# Patient Record
Sex: Male | Born: 1945 | Race: White | Hispanic: No | Smoking: Former smoker
Health system: Southern US, Community
[De-identification: ages and names within clinical notes are randomized; demographics above are authoritative.]

## PROBLEM LIST (undated history)

## (undated) DIAGNOSIS — N400 Enlarged prostate without lower urinary tract symptoms: Secondary | ICD-10-CM

## (undated) DIAGNOSIS — R51 Headache: Secondary | ICD-10-CM

## (undated) DIAGNOSIS — Z8739 Personal history of other diseases of the musculoskeletal system and connective tissue: Secondary | ICD-10-CM

## (undated) DIAGNOSIS — E785 Hyperlipidemia, unspecified: Secondary | ICD-10-CM

## (undated) DIAGNOSIS — R519 Headache, unspecified: Secondary | ICD-10-CM

## (undated) DIAGNOSIS — I10 Essential (primary) hypertension: Secondary | ICD-10-CM

## (undated) DIAGNOSIS — E119 Type 2 diabetes mellitus without complications: Secondary | ICD-10-CM

## (undated) HISTORY — DX: Hyperlipidemia, unspecified: E78.5

## (undated) HISTORY — DX: Essential (primary) hypertension: I10

## (undated) HISTORY — DX: Personal history of other diseases of the musculoskeletal system and connective tissue: Z87.39

## (undated) HISTORY — DX: Benign prostatic hyperplasia without lower urinary tract symptoms: N40.0

## (undated) HISTORY — DX: Type 2 diabetes mellitus without complications: E11.9

---

## 1987-05-06 HISTORY — PX: NASAL SINUS SURGERY: SHX719

## 1994-05-05 HISTORY — PX: UMBILICAL HERNIA REPAIR: SHX196

## 1998-05-14 ENCOUNTER — Emergency Department (HOSPITAL_COMMUNITY): Admission: EM | Admit: 1998-05-14 | Discharge: 1998-05-14 | Payer: Self-pay | Admitting: Emergency Medicine

## 1998-10-19 ENCOUNTER — Encounter: Payer: Self-pay | Admitting: General Surgery

## 1998-10-23 ENCOUNTER — Ambulatory Visit (HOSPITAL_COMMUNITY): Admission: RE | Admit: 1998-10-23 | Discharge: 1998-10-23 | Payer: Self-pay | Admitting: General Surgery

## 1998-10-23 ENCOUNTER — Encounter (INDEPENDENT_AMBULATORY_CARE_PROVIDER_SITE_OTHER): Payer: Self-pay

## 2001-05-19 ENCOUNTER — Encounter: Admission: RE | Admit: 2001-05-19 | Discharge: 2001-06-25 | Payer: Self-pay | Admitting: Internal Medicine

## 2001-07-13 ENCOUNTER — Encounter: Payer: Self-pay | Admitting: Emergency Medicine

## 2001-07-13 ENCOUNTER — Emergency Department (HOSPITAL_COMMUNITY): Admission: EM | Admit: 2001-07-13 | Discharge: 2001-07-13 | Payer: Self-pay | Admitting: Emergency Medicine

## 2001-07-14 ENCOUNTER — Emergency Department (HOSPITAL_COMMUNITY): Admission: EM | Admit: 2001-07-14 | Discharge: 2001-07-14 | Payer: Self-pay | Admitting: Emergency Medicine

## 2004-02-14 ENCOUNTER — Ambulatory Visit (HOSPITAL_COMMUNITY): Admission: RE | Admit: 2004-02-14 | Discharge: 2004-02-14 | Payer: Self-pay | Admitting: Urology

## 2004-02-14 ENCOUNTER — Ambulatory Visit (HOSPITAL_BASED_OUTPATIENT_CLINIC_OR_DEPARTMENT_OTHER): Admission: RE | Admit: 2004-02-14 | Discharge: 2004-02-14 | Payer: Self-pay | Admitting: Urology

## 2004-03-06 ENCOUNTER — Ambulatory Visit: Payer: Self-pay | Admitting: Internal Medicine

## 2004-03-13 ENCOUNTER — Ambulatory Visit: Payer: Self-pay | Admitting: Internal Medicine

## 2004-06-10 ENCOUNTER — Ambulatory Visit: Payer: Self-pay | Admitting: Internal Medicine

## 2004-06-17 ENCOUNTER — Ambulatory Visit: Payer: Self-pay | Admitting: Internal Medicine

## 2004-09-03 ENCOUNTER — Ambulatory Visit: Payer: Self-pay | Admitting: Internal Medicine

## 2004-09-12 ENCOUNTER — Ambulatory Visit: Payer: Self-pay | Admitting: Internal Medicine

## 2004-11-07 ENCOUNTER — Ambulatory Visit: Payer: Self-pay | Admitting: Internal Medicine

## 2004-11-14 ENCOUNTER — Ambulatory Visit: Payer: Self-pay | Admitting: Internal Medicine

## 2005-03-19 ENCOUNTER — Ambulatory Visit: Payer: Self-pay | Admitting: Internal Medicine

## 2005-03-24 ENCOUNTER — Ambulatory Visit: Payer: Self-pay | Admitting: Internal Medicine

## 2005-04-25 ENCOUNTER — Ambulatory Visit (HOSPITAL_BASED_OUTPATIENT_CLINIC_OR_DEPARTMENT_OTHER): Admission: RE | Admit: 2005-04-25 | Discharge: 2005-04-25 | Payer: Self-pay | Admitting: Internal Medicine

## 2005-04-30 ENCOUNTER — Ambulatory Visit: Payer: Self-pay | Admitting: Internal Medicine

## 2005-05-05 HISTORY — PX: KIDNEY STONE SURGERY: SHX686

## 2005-05-07 ENCOUNTER — Ambulatory Visit: Payer: Self-pay | Admitting: Internal Medicine

## 2005-05-09 ENCOUNTER — Ambulatory Visit: Payer: Self-pay | Admitting: Pulmonary Disease

## 2005-08-04 ENCOUNTER — Ambulatory Visit: Payer: Self-pay | Admitting: Internal Medicine

## 2005-09-02 ENCOUNTER — Ambulatory Visit: Payer: Self-pay | Admitting: Internal Medicine

## 2005-10-21 ENCOUNTER — Ambulatory Visit: Payer: Self-pay | Admitting: Internal Medicine

## 2005-10-23 ENCOUNTER — Ambulatory Visit: Payer: Self-pay | Admitting: Internal Medicine

## 2005-12-12 ENCOUNTER — Ambulatory Visit: Payer: Self-pay | Admitting: Internal Medicine

## 2005-12-17 ENCOUNTER — Ambulatory Visit: Payer: Self-pay | Admitting: Internal Medicine

## 2006-02-23 ENCOUNTER — Ambulatory Visit: Payer: Self-pay | Admitting: Internal Medicine

## 2006-02-23 LAB — CONVERTED CEMR LAB: Hgb A1c MFr Bld: 7.1 % — ABNORMAL HIGH (ref 4.6–6.0)

## 2006-12-08 ENCOUNTER — Encounter: Payer: Self-pay | Admitting: Internal Medicine

## 2007-01-25 ENCOUNTER — Ambulatory Visit: Payer: Self-pay | Admitting: Internal Medicine

## 2007-01-25 LAB — CONVERTED CEMR LAB
ALT: 22 units/L (ref 0–53)
AST: 21 units/L (ref 0–37)
Albumin: 3.9 g/dL (ref 3.5–5.2)
Alkaline Phosphatase: 49 units/L (ref 39–117)
BUN: 20 mg/dL (ref 6–23)
Basophils Absolute: 0 10*3/uL (ref 0.0–0.1)
Basophils Relative: 0.4 % (ref 0.0–1.0)
Bilirubin Urine: NEGATIVE
Bilirubin, Direct: 0.2 mg/dL (ref 0.0–0.3)
Blood in Urine, dipstick: NEGATIVE
CO2: 26 meq/L (ref 19–32)
Calcium: 9.1 mg/dL (ref 8.4–10.5)
Chloride: 110 meq/L (ref 96–112)
Cholesterol: 130 mg/dL (ref 0–200)
Creatinine, Ser: 1.2 mg/dL (ref 0.4–1.5)
Creatinine,U: 197 mg/dL
Eosinophils Absolute: 0.2 10*3/uL (ref 0.0–0.6)
Eosinophils Relative: 3.1 % (ref 0.0–5.0)
GFR calc Af Amer: 79 mL/min
GFR calc non Af Amer: 65 mL/min
Glucose, Bld: 168 mg/dL — ABNORMAL HIGH (ref 70–99)
Glucose, Urine, Semiquant: NEGATIVE
HCT: 42.5 % (ref 39.0–52.0)
HDL: 38.6 mg/dL — ABNORMAL LOW (ref >39.0)
Hemoglobin: 14.5 g/dL (ref 13.0–17.0)
Hgb A1c MFr Bld: 6.9 % — ABNORMAL HIGH (ref 4.6–6.0)
Ketones, urine, test strip: NEGATIVE
LDL Cholesterol: 76 mg/dL (ref 0–99)
Lymphocytes Relative: 30.4 % (ref 12.0–46.0)
MCHC: 34.1 g/dL (ref 30.0–36.0)
MCV: 94.9 fL (ref 78.0–100.0)
Microalb Creat Ratio: 4.1 mg/g (ref 0.0–30.0)
Microalb, Ur: 0.8 mg/dL (ref 0.0–1.9)
Monocytes Absolute: 0.4 10*3/uL (ref 0.2–0.7)
Monocytes Relative: 7.6 % (ref 3.0–11.0)
Neutro Abs: 3 10*3/uL (ref 1.4–7.7)
Neutrophils Relative %: 58.5 % (ref 43.0–77.0)
Nitrite: NEGATIVE
PSA: 3.79 ng/mL (ref 0.10–4.00)
Platelets: 204 10*3/uL (ref 150–400)
Potassium: 5.3 meq/L — ABNORMAL HIGH (ref 3.5–5.1)
Protein, U semiquant: NEGATIVE
RBC: 4.48 M/uL (ref 4.22–5.81)
RDW: 12.6 % (ref 11.5–14.6)
Sodium: 141 meq/L (ref 135–145)
Specific Gravity, Urine: 1.025
TSH: 1.14 microintl units/mL (ref 0.35–5.50)
Total Bilirubin: 1.5 mg/dL — ABNORMAL HIGH (ref 0.3–1.2)
Total CHOL/HDL Ratio: 3.4
Total Protein: 6.1 g/dL (ref 6.0–8.3)
Triglycerides: 78 mg/dL (ref 0–149)
Urobilinogen, UA: 0.2
VLDL: 16 mg/dL (ref 0–40)
WBC: 5.2 10*3/uL (ref 4.5–10.5)
pH: 5

## 2007-02-05 ENCOUNTER — Ambulatory Visit: Payer: Self-pay | Admitting: Internal Medicine

## 2007-02-05 DIAGNOSIS — E119 Type 2 diabetes mellitus without complications: Secondary | ICD-10-CM | POA: Insufficient documentation

## 2007-02-05 DIAGNOSIS — E785 Hyperlipidemia, unspecified: Secondary | ICD-10-CM | POA: Insufficient documentation

## 2007-02-05 DIAGNOSIS — E1165 Type 2 diabetes mellitus with hyperglycemia: Secondary | ICD-10-CM

## 2007-02-05 DIAGNOSIS — N400 Enlarged prostate without lower urinary tract symptoms: Secondary | ICD-10-CM

## 2007-02-05 HISTORY — DX: Hyperlipidemia, unspecified: E78.5

## 2007-02-05 HISTORY — DX: Benign prostatic hyperplasia without lower urinary tract symptoms: N40.0

## 2007-02-05 HISTORY — DX: Type 2 diabetes mellitus without complications: E11.9

## 2007-06-07 ENCOUNTER — Ambulatory Visit: Payer: Self-pay | Admitting: Internal Medicine

## 2007-06-07 LAB — CONVERTED CEMR LAB: Hgb A1c MFr Bld: 8 % — ABNORMAL HIGH (ref 4.6–6.0)

## 2007-06-14 ENCOUNTER — Ambulatory Visit: Payer: Self-pay | Admitting: Internal Medicine

## 2007-06-14 LAB — CONVERTED CEMR LAB
Cholesterol, target level: 200 mg/dL
HDL goal, serum: 40 mg/dL
LDL Goal: 100 mg/dL

## 2007-08-09 ENCOUNTER — Ambulatory Visit: Payer: Self-pay | Admitting: Internal Medicine

## 2007-08-09 LAB — CONVERTED CEMR LAB: Hgb A1c MFr Bld: 7.3 % — ABNORMAL HIGH (ref 4.6–6.0)

## 2007-08-16 ENCOUNTER — Ambulatory Visit: Payer: Self-pay | Admitting: Internal Medicine

## 2007-11-08 ENCOUNTER — Ambulatory Visit: Payer: Self-pay | Admitting: Internal Medicine

## 2007-11-08 LAB — CONVERTED CEMR LAB: Hgb A1c MFr Bld: 7.1 % — ABNORMAL HIGH (ref 4.6–6.0)

## 2007-11-15 ENCOUNTER — Ambulatory Visit: Payer: Self-pay | Admitting: Internal Medicine

## 2007-11-19 ENCOUNTER — Encounter: Payer: Self-pay | Admitting: Internal Medicine

## 2008-03-07 ENCOUNTER — Ambulatory Visit: Payer: Self-pay | Admitting: Internal Medicine

## 2008-03-07 LAB — CONVERTED CEMR LAB
Albumin: 3.7 g/dL (ref 3.5–5.2)
Alkaline Phosphatase: 55 units/L (ref 39–117)
BUN: 17 mg/dL (ref 6–23)
Bilirubin Urine: NEGATIVE
Cholesterol: 119 mg/dL (ref 0–200)
Eosinophils Absolute: 0.1 10*3/uL (ref 0.0–0.7)
Eosinophils Relative: 1.8 % (ref 0.0–5.0)
GFR calc Af Amer: 97 mL/min
GFR calc non Af Amer: 80 mL/min
HCT: 40.9 % (ref 39.0–52.0)
HDL: 41.6 mg/dL (ref >39.0)
Hemoglobin, Urine: NEGATIVE
Ketones, ur: NEGATIVE mg/dL
MCV: 94.1 fL (ref 78.0–100.0)
Monocytes Absolute: 0.4 10*3/uL (ref 0.1–1.0)
PSA: 4.01 ng/mL — ABNORMAL HIGH (ref 0.10–4.00)
Platelets: 200 10*3/uL (ref 150–400)
Potassium: 4.5 meq/L (ref 3.5–5.1)
RDW: 12.4 % (ref 11.5–14.6)
Sodium: 138 meq/L (ref 135–145)
TSH: 1.24 microintl units/mL (ref 0.35–5.50)
Total Protein, Urine: NEGATIVE mg/dL
Urine Glucose: 100 mg/dL — AB
VLDL: 14 mg/dL (ref 0–40)

## 2008-03-13 ENCOUNTER — Ambulatory Visit: Payer: Self-pay | Admitting: Internal Medicine

## 2008-03-20 ENCOUNTER — Emergency Department (HOSPITAL_COMMUNITY): Admission: EM | Admit: 2008-03-20 | Discharge: 2008-03-20 | Payer: Self-pay | Admitting: Emergency Medicine

## 2008-03-28 ENCOUNTER — Telehealth: Payer: Self-pay | Admitting: Internal Medicine

## 2008-04-07 ENCOUNTER — Encounter: Payer: Self-pay | Admitting: Internal Medicine

## 2008-06-26 ENCOUNTER — Ambulatory Visit: Payer: Self-pay | Admitting: Internal Medicine

## 2008-06-26 LAB — CONVERTED CEMR LAB: Hgb A1c MFr Bld: 8 % — ABNORMAL HIGH (ref 4.6–6.0)

## 2008-07-03 ENCOUNTER — Ambulatory Visit: Payer: Self-pay | Admitting: Internal Medicine

## 2008-09-04 ENCOUNTER — Ambulatory Visit: Payer: Self-pay | Admitting: Internal Medicine

## 2008-09-04 LAB — CONVERTED CEMR LAB: Hgb A1c MFr Bld: 7.8 % — ABNORMAL HIGH (ref 4.6–6.5)

## 2008-09-11 ENCOUNTER — Ambulatory Visit: Payer: Self-pay | Admitting: Internal Medicine

## 2008-10-09 ENCOUNTER — Ambulatory Visit: Payer: Self-pay | Admitting: Internal Medicine

## 2008-12-05 ENCOUNTER — Ambulatory Visit: Payer: Self-pay | Admitting: Internal Medicine

## 2008-12-05 LAB — CONVERTED CEMR LAB
Calcium: 9 mg/dL (ref 8.4–10.5)
Chloride: 106 meq/L (ref 96–112)
Creatinine, Ser: 1 mg/dL (ref 0.4–1.5)
GFR calc non Af Amer: 80.04 mL/min (ref >60)
Hgb A1c MFr Bld: 9.8 % — ABNORMAL HIGH (ref 4.6–6.5)

## 2008-12-12 ENCOUNTER — Ambulatory Visit: Payer: Self-pay | Admitting: Internal Medicine

## 2009-03-07 ENCOUNTER — Ambulatory Visit: Payer: Self-pay | Admitting: Internal Medicine

## 2009-03-07 LAB — CONVERTED CEMR LAB: Hgb A1c MFr Bld: 8.7 % — ABNORMAL HIGH (ref 4.6–6.5)

## 2009-03-12 ENCOUNTER — Ambulatory Visit: Payer: Self-pay | Admitting: Internal Medicine

## 2009-04-11 ENCOUNTER — Ambulatory Visit: Payer: Self-pay | Admitting: Internal Medicine

## 2009-05-02 ENCOUNTER — Encounter: Payer: Self-pay | Admitting: Internal Medicine

## 2009-06-18 ENCOUNTER — Encounter: Admission: RE | Admit: 2009-06-18 | Discharge: 2009-06-18 | Payer: Self-pay | Admitting: Internal Medicine

## 2009-06-20 ENCOUNTER — Encounter: Payer: Self-pay | Admitting: Internal Medicine

## 2009-06-27 ENCOUNTER — Ambulatory Visit: Payer: Self-pay | Admitting: Internal Medicine

## 2009-07-04 ENCOUNTER — Ambulatory Visit: Payer: Self-pay | Admitting: Internal Medicine

## 2009-07-04 DIAGNOSIS — R05 Cough: Secondary | ICD-10-CM

## 2009-09-26 ENCOUNTER — Ambulatory Visit: Payer: Self-pay | Admitting: Internal Medicine

## 2009-10-04 ENCOUNTER — Ambulatory Visit: Payer: Self-pay | Admitting: Internal Medicine

## 2009-11-08 ENCOUNTER — Ambulatory Visit: Payer: Self-pay | Admitting: Internal Medicine

## 2009-11-13 ENCOUNTER — Encounter: Payer: Self-pay | Admitting: Internal Medicine

## 2009-11-15 ENCOUNTER — Ambulatory Visit: Payer: Self-pay | Admitting: Internal Medicine

## 2010-01-23 ENCOUNTER — Encounter: Payer: Self-pay | Admitting: Internal Medicine

## 2010-02-13 ENCOUNTER — Ambulatory Visit: Payer: Self-pay | Admitting: Internal Medicine

## 2010-02-13 LAB — CONVERTED CEMR LAB
AST: 19 units/L (ref 0–37)
Alkaline Phosphatase: 62 units/L (ref 39–117)
BUN: 18 mg/dL (ref 6–23)
CO2: 27 meq/L (ref 19–32)
Calcium: 9 mg/dL (ref 8.4–10.5)
Creatinine, Ser: 1 mg/dL (ref 0.4–1.5)
Glucose, Bld: 239 mg/dL — ABNORMAL HIGH (ref 70–99)
LDL Cholesterol: 66 mg/dL (ref 0–99)
Total Bilirubin: 1.1 mg/dL (ref 0.3–1.2)
Total CHOL/HDL Ratio: 4

## 2010-02-20 ENCOUNTER — Ambulatory Visit: Payer: Self-pay | Admitting: Internal Medicine

## 2010-05-17 ENCOUNTER — Ambulatory Visit
Admission: RE | Admit: 2010-05-17 | Discharge: 2010-05-17 | Payer: Self-pay | Source: Home / Self Care | Attending: Internal Medicine | Admitting: Internal Medicine

## 2010-05-17 ENCOUNTER — Other Ambulatory Visit: Payer: Self-pay | Admitting: Internal Medicine

## 2010-05-17 LAB — MICROALBUMIN / CREATININE URINE RATIO
Creatinine,U: 188 mg/dL
Microalb Creat Ratio: 0.7 mg/g (ref 0.0–30.0)
Microalb, Ur: 1.3 mg/dL (ref 0.0–1.9)

## 2010-05-17 LAB — HEMOGLOBIN A1C: Hgb A1c MFr Bld: 9.9 % — ABNORMAL HIGH (ref 4.6–6.5)

## 2010-05-23 ENCOUNTER — Encounter: Payer: Self-pay | Admitting: *Deleted

## 2010-05-24 ENCOUNTER — Encounter: Payer: Self-pay | Admitting: *Deleted

## 2010-05-24 ENCOUNTER — Ambulatory Visit
Admission: RE | Admit: 2010-05-24 | Discharge: 2010-05-24 | Payer: Self-pay | Source: Home / Self Care | Attending: Internal Medicine | Admitting: Internal Medicine

## 2010-05-24 DIAGNOSIS — E0842 Diabetes mellitus due to underlying condition with diabetic polyneuropathy: Secondary | ICD-10-CM

## 2010-05-24 NOTE — Assessment & Plan Note (Addendum)
  Subjective:    Corey Nielsen is a 65 y.o. male who presents for follow-up of Type 2 diabetes mellitus.  Current symptoms/problems include paresthesia of the feet and have been worsening. Symptoms have been present for 3 months.  Known diabetic complications: peripheral neuropathy Cardiovascular risk factors: diabetes mellitus Current diabetic medications include oral agents (triple therapy): kombiglyze and insulin injections: Lantus : .  Eye exam current (within one year): yes Weight trend: increasing steadily Prior visit with dietician: no Current diet: in general, a "healthy" diet   Current exercise: aerobics and gardening  Current monitoring regimen: home blood tests - 2 times daily Home blood sugar records: fasting range: 140-200 Any episodes of hypoglycemia? no  Is He on ACE inhibitor or angiotensin II receptor blocker?  No  none none  The following portions of the patient's history were reviewed and updated as appropriate: past medical history.  Review of Systems A comprehensive review of systems was negative.    Objective:    BP 122/74  Pulse 80  Temp(Src) 98.1 F (36.7 C) (Oral)  Ht 6\' 5"  (1.956 m)  Wt 208 lb (94.348 kg)  BMI 24.67 kg/m2  General:  alert  Oropharynx: normal findings: soft palate, uvula, and tonsils normal   Eyes:  conjunctivae/corneas clear. PERRL, EOM's intact. Fundi benign.   Ears:  normal TM and external ear canal right ear  Neck: no adenopathy, supple, symmetrical, trachea midline and thyroid not enlarged, symmetric, no tenderness/mass/nodules  Thyroid:  no palpable nodule  Lung: clear to auscultation bilaterally  Heart:  regular rate and rhythm, S1, S2 normal, no murmur, click, rub or gallop  Abdomen: soft, non-tender; bowel sounds normal; no masses,  no organomegaly  Extremities: extremities normal, atraumatic, no cyanosis or edema  Skin: warm and dry, no hyperpigmentation, vitiligo, or suspicious lesions  Pulses: 2+ and symmetric    Neuro: normal without focal findings, mental status, speech normal, alert and oriented x3, PERLA and reflexes normal and symmetric   Lab Review No results found for this basename: GLUCOSE,  SODIUM,  POTASSIUM,  CHLORIDE,  CO2,  BUN,  CREATININE   none    Assessment:    Diabetes Mellitus type II, under poor control.    Plan:    1.  Rx changes: none added amaryl 4 mg 2.  Education: Reviewed 'ABCs' of diabetes management (respective goals in parentheses):  A1C (<7), blood pressure (<130/80), and cholesterol (LDL <100). 3.  Compliance at present is estimated to be fair. Efforts to improve compliance (if necessary) will be directed at dietary modifications: 3, increased exercise and regular blood sugar monitoring: 3 times daily. 4. Follow up: 3 months

## 2010-05-24 NOTE — Progress Notes (Signed)
Subjective:     Patient ID: Corey Nielsen is a 65 y.o. male.  Diabetes He presents for his follow-up diabetic visit. He has type 2 diabetes mellitus. MedicAlert identification noted: has a card in wallet. The initial diagnosis of diabetes was made 15 years ago. His disease course has been improving. There are no hypoglycemic associated symptoms. Associated symptoms include foot paresthesias. Pertinent negatives for diabetes include no blurred vision, no chest pain, no fatigue, no foot ulcerations, no polydipsia, no polyphagia, no polyuria, no visual change, no weakness and no weight loss. Symptoms are improving. He is compliant with treatment most of the time. His weight is stable. He is following a diabetic diet. Meal planning includes avoidance of concentrated sweets. He has had a previous visit with a dietician. He participates in exercise three times a week. His home blood glucose trend is decreasing steadily. His breakfast blood glucose is taken between 6-7 am. His breakfast blood glucose range is generally 110-130 mg/dl. An ACE inhibitor/angiotensin II receptor blocker is not being taken. He does not see a podiatrist.Eye exam is current (seeing dr Emily Filbert).    The following portions of the patient's history were reviewed and updated as appropriate: past social history.  Review of Systems  Constitutional: Negative for weight loss and fatigue.  Eyes: Positive for redness. Negative for blurred vision.       Injected dry eyes  Respiratory: Negative.   Cardiovascular: Negative.  Negative for chest pain.  Genitourinary: Negative.  Negative for polyuria.  Skin: Negative.   Neurological: Positive for numbness. Negative for weakness.       Numbness in feet /stocking foot distribution  Hematological: Negative for polydipsia and polyphagia.  Psychiatric/Behavioral: Negative.        Objective:   Physical Exam  Constitutional: He is oriented to person, place, and time. He appears well-developed  and well-nourished.  HENT:  Head: Normocephalic and atraumatic.  Eyes: Right conjunctiva is injected.         There is redness and vein prominance  Cardiovascular: Normal rate, regular rhythm and normal heart sounds.   Pulmonary/Chest: Effort normal and breath sounds normal.  Abdominal: Soft. Bowel sounds are normal.  Neurological: He is alert and oriented to person, place, and time.  Skin: Skin is warm and dry.       Assessment:     1. Poorly controlled DM    Plan:           2

## 2010-06-06 NOTE — Assessment & Plan Note (Signed)
Summary: 3 month rov/njr   Vital Signs:  Patient profile:   65 year old male Height:      77 inches Weight:      210 pounds Temp:     98.2 degrees F oral Pulse rate:   76 / minute Resp:     14 per minute BP sitting:   136 / 80  (left arm)  Vitals Entered By: Willy Eddy, LPN (February 20, 2010 11:23 AM) CC: roa labs, Lipid Management, Type 2 diabetes mellitus follow-up Is Patient Diabetic? No   Primary Care Provider:  Stacie Glaze MD  CC:  roa labs, Lipid Management, and Type 2 diabetes mellitus follow-up.  History of Present Illness: follow up for DM and lipids  Type 2 Diabetes Mellitus Follow-Up      This is a 65 year old man who presents for Type 2 diabetes mellitus follow-up.  poor control with glucoses in the 200 range.  The patient denies polyuria, polydipsia, blurred vision, self managed hypoglycemia, hypoglycemia requiring help, weight loss, weight gain, and numbness of extremities.  Since the last visit the patient reports compliance with medications and not exercising regularly.  The patient has been measuring capillary blood glucose before breakfast and before dinner.  Since the last visit, the patient reports having had eye care by an ophthalmologist.    Lipid Management History:      Positive NCEP/ATP III risk factors include male age 2 years old or older and diabetes.  Negative NCEP/ATP III risk factors include non-tobacco-user status, non-hypertensive, no ASHD (atherosclerotic heart disease), no prior stroke/TIA, no peripheral vascular disease, and no history of aortic aneurysm.     Preventive Screening-Counseling & Management  Alcohol-Tobacco     Smoking Status: quit     Tobacco Counseling: not indicated; no tobacco use  Current Problems (verified): 1)  Cough  (ICD-786.2) 2)  Family History of Cad Male 1st Degree Relative <50  (ICD-V17.3) 3)  Family History Diabetes 1st Degree Relative  (ICD-V18.0) 4)  Benign Prostatic Hypertrophy  (ICD-600.00) 5)   Hyperlipidemia  (ICD-272.4) 6)  Diabetes Mellitus, Type II  (ICD-250.00) 7)  Physical Examination  (ICD-V70.0)  Current Medications (verified): 1)  Lipitor 20 Mg  Tabs (Atorvastatin Calcium) .... Once Daily 2)  Vitamin C 1000 Mg  Tabs (Ascorbic Acid) .... Once Daily 3)  Vitamin D-1000 Max St [phone] Mg-Unit  Tabs (Calcium-Vitamin D) .... Once Daily 4)  Fish Oil 1000 Mg  Caps (Omega-3 Fatty Acids) .... 4 Capsules Days 5)  Alpha Lop0ic 200 Mg .... Once Daily 6)  Saw Palmetto Extract 160 Mg  Caps (Saw Palmetto (Serenoa Repens)) .... 2 Once Daily 7)  Bayer Low Strength 81 Mg  Tbec (Aspirin) .... Once Daily 8)  Freestyle Lite   Strp (Glucose Blood) .... Use As Directed Bid 9)  Co Q-10 30-5 Mg-Unit Caps (Coenzyme Q10-Vitamin E) .Marland Kitchen.. 1 Once Daily 10)  Kombiglyze Xr 09-998 Mg Xr24h-Tab (Saxagliptin-Metformin) .... One By Mouth Daily 11)  Lantus 100 Unit/ml Soln (Insulin Glargine) .... 30  Untis 12)  Calcium 1000 Mg .Marland Kitchen.. 1 Once Daily 13)  Jalyn 0.5-0.4 Mg Caps (Dutasteride-Tamsulosin Hcl) .... One By Mouth Daily  Allergies (verified): 1)  ! * Dt  Past History:  Family History: Last updated: 02/05/2007 Family History of Prostate CA 1st degree relative <50 Family History Diabetes 1st degree relative Family History of CAD Male 1st degree relative <50  MID 21'S  Social History: Last updated: 02/05/2007 Domestic Partner Former Smoker Alcohol use-yes Drug  use-no Regular exercise-yes  Risk Factors: Exercise: yes (02/05/2007)  Risk Factors: Smoking Status: quit (02/20/2010)  Past medical, surgical, family and social histories (including risk factors) reviewed, and no changes noted (except as noted below).  Past Medical History: Reviewed history from 02/05/2007 and no changes required. Diabetes mellitus, type II Hyperlipidemia Unremarkable Benign prostatic hypertrophy  Past Surgical History: Reviewed history from 02/05/2007 and no changes required. FOOT SURGERY Sinus  surgery Appendectomy  Family History: Reviewed history from 02/05/2007 and no changes required. Family History of Prostate CA 1st degree relative <50 Family History Diabetes 1st degree relative Family History of CAD Male 1st degree relative <50  MID 31'S  Social History: Reviewed history from 02/05/2007 and no changes required. Domestic Partner Former Smoker Alcohol use-yes Drug use-no Regular exercise-yes  Review of Systems  The patient denies anorexia, fever, weight loss, weight gain, vision loss, decreased hearing, hoarseness, chest pain, syncope, dyspnea on exertion, peripheral edema, prolonged cough, headaches, hemoptysis, abdominal pain, melena, hematochezia, severe indigestion/heartburn, hematuria, incontinence, genital sores, muscle weakness, suspicious skin lesions, transient blindness, difficulty walking, depression, unusual weight change, abnormal bleeding, enlarged lymph nodes, angioedema, breast masses, and testicular masses.         Flu Vaccine Consent Questions     Do you have a history of severe allergic reactions to this vaccine? no    Any prior history of allergic reactions to egg and/or gelatin? no    Do you have a sensitivity to the preservative Thimersol? no    Do you have a past history of Guillan-Barre Syndrome? no    Do you currently have an acute febrile illness? no    Have you ever had a severe reaction to latex? no    Vaccine information given and explained to patient? yes    Are you currently pregnant? no    Lot Number:[id number]   Exp Date:11/02/2010   Site Given  Left Deltoid IM   Physical Exam  General:  alert and well-nourished.   Head:  atraumatic.   Eyes:  pupils equal and pupils round.   Ears:  R ear normal and L ear normal.   Nose:  no external deformity and no nasal discharge.   Mouth:  good dentition and pharynx pink and moist.   Neck:  No deformities, masses, or tenderness noted. Lungs:  normal respiratory effort and no wheezes.    Heart:  normal rate and regular rhythm.   Abdomen:  soft and no splenomegaly.   Msk:  No deformity or scoliosis noted of thoracic or lumbar spine.   Pulses:  R and L carotid,radial,femoral,dorsalis pedis and posterior tibial pulses are full and equal bilaterally Extremities:  No clubbing, cyanosis, edema, or deformity noted with normal full range of motion of all joints.    Diabetes Management Exam:    Foot Exam (with socks and/or shoes not present):       Sensory-Pinprick/Light touch:          Left medial foot (L-4): normal          Left dorsal foot (L-5): normal          Left lateral foot (S-1): normal          Right medial foot (L-4): normal          Right dorsal foot (L-5): normal          Right lateral foot (S-1): normal       Sensory-Monofilament:          Left foot: normal  Right foot: normal       Inspection:          Left foot: normal          Right foot: normal       Nails:          Left foot: normal          Right foot: normal    Eye Exam:       Eye Exam done elsewhere          Date: 11/29/2009          Results: normal          Done by: Emily Filbert   Impression & Recommendations:  Problem # 1:  DIABETES MELLITUS, TYPE II (ICD-250.00)  The following medications were removed from the medication list:    Glimepiride 4 Mg Tabs (Glimepiride) ..... One by mouth daily His updated medication list for this problem includes:    Bayer Low Strength 81 Mg Tbec (Aspirin) ..... Once daily    Kombiglyze Xr 09-998 Mg Xr24h-tab (Saxagliptin-metformin) ..... One by mouth daily    Lantus 100 Unit/ml Soln (Insulin glargine) .Marland KitchenMarland KitchenMarland KitchenMarland Kitchen 30  untis  Labs Reviewed: Creat: 1.0 (12/05/2008)    Reviewed HgBA1c results: 8.5 (11/08/2009)  8.7 (09/26/2009)  Problem # 2:  HYPERLIPIDEMIA (ICD-272.4)  His updated medication list for this problem includes:    Lipitor 20 Mg Tabs (Atorvastatin calcium) ..... Once daily  Labs Reviewed: SGOT: 25 (03/07/2008)   SGPT: 24 (03/07/2008)  Lipid  Goals: Chol Goal: 200 (06/14/2007)   HDL Goal: 40 (06/14/2007)   LDL Goal: 100 (06/14/2007)   TG Goal: 150 (06/14/2007)  Prior 10 Yr Risk Heart Disease: 9 % (10/04/2009)   HDL:41.6 (03/07/2008), 38.6 (01/25/2007)  LDL:64 (03/07/2008), 76 (01/25/2007)  Chol:119 (03/07/2008), 130 (01/25/2007)  Trig:69 (03/07/2008), 78 (01/25/2007)  Complete Medication List: 1)  Lipitor 20 Mg Tabs (Atorvastatin calcium) .... Once daily 2)  Vitamin C 1000 Mg Tabs (Ascorbic acid) .... Once daily 3)  Vitamin D-1000 Max St [phone] Mg-unit Tabs (Calcium-vitamin d) .... Once daily 4)  Fish Oil 1000 Mg Caps (Omega-3 fatty acids) .... 4 capsules days 5)  Alpha Lop0ic 200 Mg  .... Once daily 6)  Saw Palmetto Extract 160 Mg Caps (Saw palmetto (serenoa repens)) .... 2 once daily 7)  Bayer Low Strength 81 Mg Tbec (Aspirin) .... Once daily 8)  Freestyle Lite Strp (Glucose blood) .... Use as directed bid 9)  Co Q-10 30-5 Mg-unit Caps (Coenzyme q10-vitamin e) .Marland Kitchen.. 1 once daily 10)  Kombiglyze Xr 09-998 Mg Xr24h-tab (Saxagliptin-metformin) .... One by mouth daily 11)  Lantus 100 Unit/ml Soln (Insulin glargine) .... 30  untis 12)  Calcium 1000 Mg  .Marland Kitchen.. 1 once daily 13)  Jalyn 0.5-0.4 Mg Caps (Dutasteride-tamsulosin hcl) .... One by mouth daily  Other Orders: Admin 1st Vaccine (16109) Flu Vaccine 57yrs + [phone])  Lipid Assessment/Plan:      Based on NCEP/ATP III, the patient's risk factor category is "history of diabetes".  The patient's lipid goals are as follows: Total cholesterol goal is 200; LDL cholesterol goal is 100; HDL cholesterol goal is 40; Triglyceride goal is 150.    Patient Instructions: 1)  Please schedule a follow-up appointment in 3 months. 2)  HbgA1C prior to visit, ICD-9:250.00 3)  Urine Microalbumin prior to visit, ICD-9:250.00   Orders Added: 1)  Admin 1st Vaccine [90471] 2)  Flu Vaccine 44yrs + [09811] 3)  Est. Patient Level IV [91478]

## 2010-06-06 NOTE — Assessment & Plan Note (Deleted)
Summary: 3 mo rov/mm   Vital Signs:  Patient profile:   65 year old male Height:      77 inches Weight:      208 pounds BMI:     24.75 Temp:     98.2 degrees F oral Pulse rate:   80 / minute Resp:     14 per minute BP sitting:   122 / 74  (left arm)  Vitals Entered By: Willy Eddy, LPN (May 24, 2010 11:06 AM) CC: roa labs Is Patient Diabetic? Yes Did you bring your meter with you today? No   Primary Care Provider:  Stacie Glaze MD  CC:  roa labs.  History of Present Illness: dm follow up documented in epic and cut and pasted  tes

## 2010-06-06 NOTE — Letter (Signed)
Summary: [facility]  [facility]   Imported By: Maryln Gottron 11/19/2009 11:03:30  _____________________________________________________________________  External Attachment:    Type:   Image     Comment:   External Document

## 2010-06-06 NOTE — Letter (Signed)
Summary: [facility]   Imported By: Maryln Gottron 05/07/2009 14:01:42  _____________________________________________________________________  External Attachment:    Type:   Image     Comment:   External Document

## 2010-06-06 NOTE — Letter (Signed)
Summary: [facility]  [facility]   Imported By: Maryln Gottron 01/29/2010 13:04:28  _____________________________________________________________________  External Attachment:    Type:   Image     Comment:   External Document

## 2010-06-06 NOTE — Assessment & Plan Note (Signed)
Summary: 6 wk rov/njr   Vital Signs:  Patient profile:   65 year old male Height:      77 inches Weight:      209 pounds BMI:     24.87 Temp:     98.2 degrees F oral Pulse rate:   72 / minute Resp:     14 per minute BP sitting:   122 / 80  (left arm)  Vitals Entered By: Willy Eddy, LPN (November 15, 2009 12:05 PM) CC: roa labs   CC:  roa labs.  History of Present Illness: follow up for DM that has been diffficult to control partly due to diet and also lack of scheduled exercize medication monitering has not had any hypoglycemia moniters glucoses intermintantly but his reading have been in the 150 range  Preventive Screening-Counseling & Management  Alcohol-Tobacco     Smoking Status: quit  Problems Prior to Update: 1)  Cough  (ICD-786.2) 2)  Family History of Cad Male 1st Degree Relative <50  (ICD-V17.3) 3)  Family History Diabetes 1st Degree Relative  (ICD-V18.0) 4)  Benign Prostatic Hypertrophy  (ICD-600.00) 5)  Hyperlipidemia  (ICD-272.4) 6)  Diabetes Mellitus, Type II  (ICD-250.00) 7)  Physical Examination  (ICD-V70.0)  Current Problems (verified): 1)  Cough  (ICD-786.2) 2)  Family History of Cad Male 1st Degree Relative <50  (ICD-V17.3) 3)  Family History Diabetes 1st Degree Relative  (ICD-V18.0) 4)  Benign Prostatic Hypertrophy  (ICD-600.00) 5)  Hyperlipidemia  (ICD-272.4) 6)  Diabetes Mellitus, Type II  (ICD-250.00) 7)  Physical Examination  (ICD-V70.0)  Medications Prior to Update: 1)  Lipitor 20 Mg  Tabs (Atorvastatin Calcium) .... Once Daily 2)  Vitamin C 1000 Mg  Tabs (Ascorbic Acid) .... Once Daily 3)  Vitamin D-1000 Max St [phone] Mg-Unit  Tabs (Calcium-Vitamin D) .... Once Daily 4)  Fish Oil 1000 Mg  Caps (Omega-3 Fatty Acids) .... 4 Capsules Days 5)  Alpha Lop0ic 200 Mg .... Once Daily 6)  Saw Palmetto Extract 160 Mg  Caps (Saw Palmetto (Serenoa Repens)) .... 2 Once Daily 7)  Bayer Low Strength 81 Mg  Tbec (Aspirin) .... Once Daily 8)   Freestyle Lite   Strp (Glucose Blood) .... Use As Directed Bid 9)  Co Q-10 30-5 Mg-Unit Caps (Coenzyme Q10-Vitamin E) .Marland Kitchen.. 1 Once Daily 10)  Kombiglyze Xr 09-998 Mg Xr24h-Tab (Saxagliptin-Metformin) .... One By Mouth Daily 11)  Lantus 100 Unit/ml Soln (Insulin Glargine) .... 20 Untis 12)  Calcium 1000 Mg .Marland Kitchen.. 1 Once Daily 13)  Jalyn 0.5-0.4 Mg Caps (Dutasteride-Tamsulosin Hcl) .... One By Mouth Daily 14)  Glimepiride 4 Mg Tabs (Glimepiride) .... One By Mouth Daily  Current Medications (verified): 1)  Lipitor 20 Mg  Tabs (Atorvastatin Calcium) .... Once Daily 2)  Vitamin C 1000 Mg  Tabs (Ascorbic Acid) .... Once Daily 3)  Vitamin D-1000 Max St [phone] Mg-Unit  Tabs (Calcium-Vitamin D) .... Once Daily 4)  Fish Oil 1000 Mg  Caps (Omega-3 Fatty Acids) .... 4 Capsules Days 5)  Alpha Lop0ic 200 Mg .... Once Daily 6)  Saw Palmetto Extract 160 Mg  Caps (Saw Palmetto (Serenoa Repens)) .... 2 Once Daily 7)  Bayer Low Strength 81 Mg  Tbec (Aspirin) .... Once Daily 8)  Freestyle Lite   Strp (Glucose Blood) .... Use As Directed Bid 9)  Co Q-10 30-5 Mg-Unit Caps (Coenzyme Q10-Vitamin E) .Marland Kitchen.. 1 Once Daily 10)  Kombiglyze Xr 09-998 Mg Xr24h-Tab (Saxagliptin-Metformin) .... One By Mouth Daily 11)  Lantus 100 Unit/ml  Soln (Insulin Glargine) .... 20 Untis 12)  Calcium 1000 Mg .Marland Kitchen.. 1 Once Daily 13)  Jalyn 0.5-0.4 Mg Caps (Dutasteride-Tamsulosin Hcl) .... One By Mouth Daily 14)  Glimepiride 4 Mg Tabs (Glimepiride) .... One By Mouth Daily  Allergies (verified): 1)  ! * Dt  Past History:  Family History: Last updated: 02/05/2007 Family History of Prostate CA 1st degree relative <50 Family History Diabetes 1st degree relative Family History of CAD Male 1st degree relative <50  MID 47'S  Social History: Last updated: 02/05/2007 Domestic Partner Former Smoker Alcohol use-yes Drug use-no Regular exercise-yes  Risk Factors: Exercise: yes (02/05/2007)  Risk Factors: Smoking Status: quit  (11/15/2009)  Past medical, surgical, family and social histories (including risk factors) reviewed, and no changes noted (except as noted below).  Past Medical History: Reviewed history from 02/05/2007 and no changes required. Diabetes mellitus, type II Hyperlipidemia Unremarkable Benign prostatic hypertrophy  Past Surgical History: Reviewed history from 02/05/2007 and no changes required. FOOT SURGERY Sinus surgery Appendectomy  Family History: Reviewed history from 02/05/2007 and no changes required. Family History of Prostate CA 1st degree relative <50 Family History Diabetes 1st degree relative Family History of CAD Male 1st degree relative <50  MID 88'S  Social History: Reviewed history from 02/05/2007 and no changes required. Domestic Partner Former Smoker Alcohol use-yes Drug use-no Regular exercise-yes  Review of Systems  The patient denies anorexia, fever, weight loss, weight gain, vision loss, decreased hearing, hoarseness, chest pain, syncope, dyspnea on exertion, peripheral edema, prolonged cough, headaches, hemoptysis, abdominal pain, melena, hematochezia, severe indigestion/heartburn, hematuria, incontinence, genital sores, muscle weakness, suspicious skin lesions, transient blindness, difficulty walking, depression, unusual weight change, abnormal bleeding, enlarged lymph nodes, angioedema, and breast masses.    Physical Exam  General:  alert and well-nourished.   Head:  atraumatic.   Eyes:  pupils equal and pupils round.   Ears:  R ear normal and L ear normal.   Nose:  no external deformity and no nasal discharge.   Neck:  No deformities, masses, or tenderness noted. Chest Wall:  No deformities, masses, tenderness or gynecomastia noted. Lungs:  normal respiratory effort and no wheezes.   Heart:  normal rate and regular rhythm.   Abdomen:  soft and no splenomegaly.   Msk:  No deformity or scoliosis noted of thoracic or lumbar spine.   Pulses:  R and L  carotid,radial,femoral,dorsalis pedis and posterior tibial pulses are full and equal bilaterally Extremities:  No clubbing, cyanosis, edema, or deformity noted with normal full range of motion of all joints.    Diabetes Management Exam:    Foot Exam (with socks and/or shoes not present):       Sensory-Pinprick/Light touch:          Left medial foot (L-4): normal          Left dorsal foot (L-5): normal          Left lateral foot (S-1): normal          Right medial foot (L-4): normal          Right dorsal foot (L-5): normal          Right lateral foot (S-1): normal       Sensory-Monofilament:          Left foot: normal          Right foot: normal       Inspection:          Left foot: normal  Right foot: normal       Nails:          Left foot: normal          Right foot: normal   Impression & Recommendations:  Problem # 1:  DIABETES MELLITUS, TYPE II (ICD-250.00) tolerated the new medications well, nloted the blood pressure His updated medication list for this problem includes:    Bayer Low Strength 81 Mg Tbec (Aspirin) ..... Once daily    Kombiglyze Xr 09-998 Mg Xr24h-tab (Saxagliptin-metformin) ..... One by mouth daily    Lantus 100 Unit/ml Soln (Insulin glargine) .Marland Kitchen... 20 untis    Glimepiride 4 Mg Tabs (Glimepiride) ..... One by mouth daily  Labs Reviewed: Creat: 1.0 (12/05/2008)    Reviewed HgBA1c results: 8.5 (11/08/2009)  8.7 (09/26/2009)  Problem # 2:  HYPERLIPIDEMIA (ICD-272.4) he pt has been on a low calorie diet His updated medication list for this problem includes:    Lipitor 20 Mg Tabs (Atorvastatin calcium) ..... Once daily  Labs Reviewed: SGOT: 25 (03/07/2008)   SGPT: 24 (03/07/2008)  Lipid Goals: Chol Goal: 200 (06/14/2007)   HDL Goal: 40 (06/14/2007)   LDL Goal: 100 (06/14/2007)   TG Goal: 150 (06/14/2007)  Prior 10 Yr Risk Heart Disease: 9 % (10/04/2009)   HDL:41.6 (03/07/2008), 38.6 (01/25/2007)  LDL:64 (03/07/2008), 76 (01/25/2007)  Chol:119  (03/07/2008), 130 (01/25/2007)  Trig:69 (03/07/2008), 78 (01/25/2007)  Problem # 3:  BENIGN PROSTATIC HYPERTROPHY (ICD-600.00)  His updated medication list for this problem includes:    Jalyn 0.5-0.4 Mg Caps (Dutasteride-tamsulosin hcl) ..... One by mouth daily  PSA: 4.01 (03/07/2008)     Complete Medication List: 1)  Lipitor 20 Mg Tabs (Atorvastatin calcium) .... Once daily 2)  Vitamin C 1000 Mg Tabs (Ascorbic acid) .... Once daily 3)  Vitamin D-1000 Max St [phone] Mg-unit Tabs (Calcium-vitamin d) .... Once daily 4)  Fish Oil 1000 Mg Caps (Omega-3 fatty acids) .... 4 capsules days 5)  Alpha Lop0ic 200 Mg  .... Once daily 6)  Saw Palmetto Extract 160 Mg Caps (Saw palmetto (serenoa repens)) .... 2 once daily 7)  Bayer Low Strength 81 Mg Tbec (Aspirin) .... Once daily 8)  Freestyle Lite Strp (Glucose blood) .... Use as directed bid 9)  Co Q-10 30-5 Mg-unit Caps (Coenzyme q10-vitamin e) .Marland Kitchen.. 1 once daily 10)  Kombiglyze Xr 09-998 Mg Xr24h-tab (Saxagliptin-metformin) .... One by mouth daily 11)  Lantus 100 Unit/ml Soln (Insulin glargine) .... 20 untis 12)  Calcium 1000 Mg  .Marland Kitchen.. 1 once daily 13)  Jalyn 0.5-0.4 Mg Caps (Dutasteride-tamsulosin hcl) .... One by mouth daily 14)  Glimepiride 4 Mg Tabs (Glimepiride) .... One by mouth daily  Patient Instructions: 1)  carbohydrate control is the keep to controlling the the blood sugar 2)  Please schedule a follow-up appointment in 3 months. 3)  BMP prior to visit, ICD-9:401.9 4)  Hepatic Panel prior to visit, ICD-9:995.20 5)  Lipid Panel prior to visit, ICD-9:272.4 6)  HbgA1C prior to visit, ICD-9:250.00

## 2010-06-06 NOTE — Assessment & Plan Note (Signed)
Summary: 2 MTH ROA // RS   Vital Signs:  Patient profile:   65 year old male Height:      77 inches Weight:      220 pounds BMI:     26.18 Temp:     98.4 degrees F oral Pulse rate:   72 / minute Resp:     14 per minute BP sitting:   134 / 78  (left arm)  Vitals Entered By: Willy Eddy, LPN (July 04, 1608 1:32 PM) CC: roa labs-had fever of 103 several weeks ago with chills that just last the pm- ok then deveoped cough several days later that continues   Contraindications/Deferment of Procedures/Staging:    Test/Procedure: FLU VAX    Reason for deferment: patient declined   CC:  roa labs-had fever of 103 several weeks ago with chills that just last the pm- ok then deveoped cough several days later that continues.  History of Present Illness: the pt has noted CBG's  are lower that the drop in te Aic would indicate the A1c was high but the pt has noted these cbgs in the last two weeks only since he went to [facility] review of the recent infection that resulted in high temps and shaking chills for 48 hours and now has post infectious cough  Preventive Screening-Counseling & Management  Alcohol-Tobacco     Smoking Status: quit  Problems Prior to Update: 1)  Family History of Cad Male 1st Degree Relative <50  (ICD-V17.3) 2)  Family History Diabetes 1st Degree Relative  (ICD-V18.0) 3)  Benign Prostatic Hypertrophy  (ICD-600.00) 4)  Hyperlipidemia  (ICD-272.4) 5)  Diabetes Mellitus, Type II  (ICD-250.00) 6)  Physical Examination  (ICD-V70.0)  Medications Prior to Update: 1)  Lipitor 20 Mg  Tabs (Atorvastatin Calcium) .... Once Daily 2)  Cardura 2 Mg  Tabs (Doxazosin Mesylate) .... At Bedtime 3)  Uroxatral 10 Mg  Tb24 (Alfuzosin Hcl) .... Once Daily 4)  Vitamin C 1000 Mg  Tabs (Ascorbic Acid) .... Once Daily 5)  Vitamin D-1000 Max St [phone] Mg-Unit  Tabs (Calcium-Vitamin D) .... Once Daily 6)  Fish Oil 1000 Mg  Caps (Omega-3 Fatty Acids) .... 4 Capsules Days 7)   Alpha Lopic 200 Mg .... Once Daily 8)  Saw Palmetto Extract 160 Mg  Caps (Saw Palmetto (Serenoa Repens)) .... 2 Once Daily 9)  Bayer Low Strength 81 Mg  Tbec (Aspirin) .... Once Daily 10)  Freestyle Lite   Strp (Glucose Blood) .... Use As Directed Bid 11)  Co Q-10 30-5 Mg-Unit Caps (Coenzyme Q10-Vitamin E) .Marland Kitchen.. 1 Once Daily 12)  Duetact 30-4 Mg Tabs (Pioglitazone Hcl-Glimepiride) .... One By Mouth  Daily 13)  Avodart 0.5 Mg Caps (Dutasteride) .... One By Mouth Daily 14)  Lantus 100 Unit/ml Soln (Insulin Glargine) .... 25 Untis 15)  Calcium 1000 Mg .Marland Kitchen.. 1 Once Daily 16)  Metformin Hcl 500 Mg Tabs (Metformin Hcl) .... One By Mouth Daily  Current Medications (verified): 1)  Lipitor 20 Mg  Tabs (Atorvastatin Calcium) .... Once Daily 2)  Cardura 2 Mg  Tabs (Doxazosin Mesylate) .... At Bedtime 3)  Uroxatral 10 Mg  Tb24 (Alfuzosin Hcl) .... Once Daily 4)  Vitamin C 1000 Mg  Tabs (Ascorbic Acid) .... Once Daily 5)  Vitamin D-1000 Max St [phone] Mg-Unit  Tabs (Calcium-Vitamin D) .... Once Daily 6)  Fish Oil 1000 Mg  Caps (Omega-3 Fatty Acids) .... 4 Capsules Days 7)  Alpha Lop0ic 200 Mg .... Once Daily 8)  Saw Palmetto Extract 160 Mg  Caps (Saw Palmetto (Serenoa Repens)) .... 2 Once Daily 9)  Bayer Low Strength 81 Mg  Tbec (Aspirin) .... Once Daily 10)  Freestyle Lite   Strp (Glucose Blood) .... Use As Directed Bid 11)  Co Q-10 30-5 Mg-Unit Caps (Coenzyme Q10-Vitamin E) .Marland Kitchen.. 1 Once Daily 12)  Duetact 30-4 Mg Tabs (Pioglitazone Hcl-Glimepiride) .... One By Mouth  Daily 13)  Avodart 0.5 Mg Caps (Dutasteride) .... One By Mouth Daily 14)  Lantus 100 Unit/ml Soln (Insulin Glargine) .... 20 Untis 15)  Calcium 1000 Mg .Marland Kitchen.. 1 Once Daily 16)  Metformin Hcl 500 Mg Tabs (Metformin Hcl) .... One By Mouth Daily  Allergies (verified): 1)  ! * Dt  Past History:  Family History: Last updated: 02/05/2007 Family History of Prostate CA 1st degree relative <50 Family History Diabetes 1st degree  relative Family History of CAD Male 1st degree relative <50  MID 23'S  Social History: Last updated: 02/05/2007 Domestic Partner Former Smoker Alcohol use-yes Drug use-no Regular exercise-yes  Risk Factors: Exercise: yes (02/05/2007)  Risk Factors: Smoking Status: quit (07/04/2009)  Past medical, surgical, family and social histories (including risk factors) reviewed, and no changes noted (except as noted below).  Past Medical History: Reviewed history from 02/05/2007 and no changes required. Diabetes mellitus, type II Hyperlipidemia Unremarkable Benign prostatic hypertrophy  Past Surgical History: Reviewed history from 02/05/2007 and no changes required. FOOT SURGERY Sinus surgery Appendectomy  Family History: Reviewed history from 02/05/2007 and no changes required. Family History of Prostate CA 1st degree relative <50 Family History Diabetes 1st degree relative Family History of CAD Male 1st degree relative <50  MID 58'S  Social History: Reviewed history from 02/05/2007 and no changes required. Domestic Partner Former Smoker Alcohol use-yes Drug use-no Regular exercise-yes  Review of Systems  The patient denies anorexia, fever, weight loss, weight gain, vision loss, decreased hearing, hoarseness, chest pain, syncope, dyspnea on exertion, peripheral edema, prolonged cough, headaches, hemoptysis, abdominal pain, melena, hematochezia, severe indigestion/heartburn, hematuria, incontinence, genital sores, muscle weakness, suspicious skin lesions, transient blindness, difficulty walking, depression, unusual weight change, abnormal bleeding, enlarged lymph nodes, angioedema, and breast masses.    Physical Exam  General:  Well-developed,well-nourished,in no acute distress; alert,appropriate and cooperative throughout examination Head:  Normocephalic and atraumatic without obvious abnormalities. No apparent alopecia or balding. Eyes:  pupils equal and pupils round.    Ears:  R ear normal and L ear normal.   Nose:  no external deformity and no nasal discharge.   Mouth:  good dentition and pharynx pink and moist.   Chest Wall:  No deformities, masses, tenderness or gynecomastia noted. Lungs:  normal respiratory effort and no wheezes.   Heart:  normal rate and regular rhythm.   Abdomen:  soft and no splenomegaly.   Genitalia:  circumcised and uncircumcised.   Msk:  No deformity or scoliosis noted of thoracic or lumbar spine.   Extremities:  trace left pedal edema and trace right pedal edema.   Neurologic:  alert & oriented X3 and cranial nerves II-XII intact.     Impression & Recommendations:  Problem # 1:  DIABETES MELLITUS, TYPE II (ICD-250.00) Assessment Improved the change has been only in the last 2 weeks so the a1c does not represent this the pt fould that he wass injecting inappropriatey and was loosing efficacy His updated medication list for this problem includes:    Bayer Low Strength 81 Mg Tbec (Aspirin) ..... Once daily    Duetact 30-4 Mg  Tabs (Pioglitazone hcl-glimepiride) ..... One by mouth  daily    Lantus 100 Unit/ml Soln (Insulin glargine) .Marland Kitchen... 20 untis    Metformin Hcl 500 Mg Tabs (Metformin hcl) ..... One by mouth daily  Labs Reviewed: Creat: 1.0 (12/05/2008)    Reviewed HgBA1c results: 8.4 (06/27/2009)  8.7 (03/07/2009)  Problem # 2:  HYPERLIPIDEMIA (ICD-272.4) Assessment: Unchanged  His updated medication list for this problem includes:    Lipitor 20 Mg Tabs (Atorvastatin calcium) ..... Once daily  Labs Reviewed: SGOT: 25 (03/07/2008)   SGPT: 24 (03/07/2008)  Lipid Goals: Chol Goal: 200 (06/14/2007)   HDL Goal: 40 (06/14/2007)   LDL Goal: 100 (06/14/2007)   TG Goal: 150 (06/14/2007)  Prior 10 Yr Risk Heart Disease: 11 % (06/14/2007)   HDL:41.6 (03/07/2008), 38.6 (01/25/2007)  LDL:64 (03/07/2008), 76 (01/25/2007)  Chol:119 (03/07/2008), 130 (01/25/2007)  Trig:69 (03/07/2008), 78 (01/25/2007)  Problem # 3:  COUGH  (ICD-786.2) tussicaps  Problem # 4:  BENIGN PROSTATIC HYPERTROPHY (ICD-600.00)  His updated medication list for this problem includes:    Cardura 2 Mg Tabs (Doxazosin mesylate) .Marland Kitchen... At bedtime    Uroxatral 10 Mg Tb24 (Alfuzosin hcl) ..... Once daily    Avodart 0.5 Mg Caps (Dutasteride) ..... One by mouth daily    Jalyn 0.5-0.4 Mg Caps (Dutasteride-tamsulosin hcl) ..... One by mouth daily  PSA: 4.01 (03/07/2008)     Complete Medication List: 1)  Lipitor 20 Mg Tabs (Atorvastatin calcium) .... Once daily 2)  Cardura 2 Mg Tabs (Doxazosin mesylate) .... At bedtime 3)  Uroxatral 10 Mg Tb24 (Alfuzosin hcl) .... Once daily 4)  Vitamin C 1000 Mg Tabs (Ascorbic acid) .... Once daily 5)  Vitamin D-1000 Max St [phone] Mg-unit Tabs (Calcium-vitamin d) .... Once daily 6)  Fish Oil 1000 Mg Caps (Omega-3 fatty acids) .... 4 capsules days 7)  Alpha Lop0ic 200 Mg  .... Once daily 8)  Saw Palmetto Extract 160 Mg Caps (Saw palmetto (serenoa repens)) .... 2 once daily 9)  Bayer Low Strength 81 Mg Tbec (Aspirin) .... Once daily 10)  Freestyle Lite Strp (Glucose blood) .... Use as directed bid 11)  Co Q-10 30-5 Mg-unit Caps (Coenzyme q10-vitamin e) .Marland Kitchen.. 1 once daily 12)  Duetact 30-4 Mg Tabs (Pioglitazone hcl-glimepiride) .... One by mouth  daily 13)  Avodart 0.5 Mg Caps (Dutasteride) .... One by mouth daily 14)  Lantus 100 Unit/ml Soln (Insulin glargine) .... 20 untis 15)  Calcium 1000 Mg  .Marland Kitchen.. 1 once daily 16)  Metformin Hcl 500 Mg Tabs (Metformin hcl) .... One by mouth daily 17)  Tussicaps 10-8 Mg Xr12h-cap (Hydrocod polst-chlorphen polst) .... One by mouth two times a day 18)  Jalyn 0.5-0.4 Mg Caps (Dutasteride-tamsulosin hcl) .... One by mouth daily  Patient Instructions: 1)  Please schedule a follow-up appointment in 3 months. 2)  HbgA1C prior to visit, ICD-9: 250.82 Prescriptions: JALYN 0.5-0.4 MG CAPS (DUTASTERIDE-TAMSULOSIN HCL) one by mouth daily  #30 x 11   Entered and Authorized by:    Stacie Glaze MD   Signed by:   Stacie Glaze MD on 07/04/2009   Method used:   Electronically to        [facility] (retail)       [address]       [place], [place]  [id number]       Ph: [phone]       Fax: [phone]   RxID:   [id number] [id number] 10-8 MG XR12H-CAP (HYDROCOD POLST-CHLORPHEN POLST) one by mouth  two times a day  #20 x 0   Entered and Authorized by:   Stacie Glaze MD   Signed by:   Stacie Glaze MD on 07/04/2009   Method used:   Print then Give to Patient   RxID:   [id number]

## 2010-06-06 NOTE — Assessment & Plan Note (Signed)
Summary: 3 month follow up/cjr   Vital Signs:  Patient profile:   65 year old male Height:      77 inches Weight:      212 pounds BMI:     25.23 Temp:     98.2 degrees F oral Pulse rate:   72 / minute Resp:     14 per minute BP sitting:   120 / 70  (left arm)  Vitals Entered By: Willy Eddy, LPN (October 04, 1608 9:24 AM) CC: roa labs, Lipid Management   CC:  roa labs and Lipid Management.  History of Present Illness: the pt has been poorly controlled  Lipid Management History:      Positive NCEP/ATP III risk factors include male age 42 years old or older and diabetes.  Negative NCEP/ATP III risk factors include non-tobacco-user status, non-hypertensive, no ASHD (atherosclerotic heart disease), no prior stroke/TIA, no peripheral vascular disease, and no history of aortic aneurysm.     Preventive Screening-Counseling & Management  Alcohol-Tobacco     Smoking Status: quit  Current Problems (verified): 1)  Cough  (ICD-786.2) 2)  Family History of Cad Male 1st Degree Relative <50  (ICD-V17.3) 3)  Family History Diabetes 1st Degree Relative  (ICD-V18.0) 4)  Benign Prostatic Hypertrophy  (ICD-600.00) 5)  Hyperlipidemia  (ICD-272.4) 6)  Diabetes Mellitus, Type II  (ICD-250.00) 7)  Physical Examination  (ICD-V70.0)  Current Medications (verified): 1)  Lipitor 20 Mg  Tabs (Atorvastatin Calcium) .... Once Daily 2)  Cardura 2 Mg  Tabs (Doxazosin Mesylate) .... At Bedtime 3)  Uroxatral 10 Mg  Tb24 (Alfuzosin Hcl) .... Once Daily 4)  Vitamin C 1000 Mg  Tabs (Ascorbic Acid) .... Once Daily 5)  Vitamin D-1000 Max St [phone] Mg-Unit  Tabs (Calcium-Vitamin D) .... Once Daily 6)  Fish Oil 1000 Mg  Caps (Omega-3 Fatty Acids) .... 4 Capsules Days 7)  Alpha Lop0ic 200 Mg .... Once Daily 8)  Saw Palmetto Extract 160 Mg  Caps (Saw Palmetto (Serenoa Repens)) .... 2 Once Daily 9)  Bayer Low Strength 81 Mg  Tbec (Aspirin) .... Once Daily 10)  Freestyle Lite   Strp (Glucose Blood) ....  Use As Directed Bid 11)  Co Q-10 30-5 Mg-Unit Caps (Coenzyme Q10-Vitamin E) .Marland Kitchen.. 1 Once Daily 12)  Duetact 30-4 Mg Tabs (Pioglitazone Hcl-Glimepiride) .... One By Mouth  Daily 13)  Avodart 0.5 Mg Caps (Dutasteride) .... One By Mouth Daily 14)  Lantus 100 Unit/ml Soln (Insulin Glargine) .... 20 Untis 15)  Calcium 1000 Mg .Marland Kitchen.. 1 Once Daily 16)  Metformin Hcl 500 Mg Tabs (Metformin Hcl) .... One By Mouth Daily 17)  Tussicaps 10-8 Mg Xr12h-Cap (Hydrocod Polst-Chlorphen Polst) .... One By Mouth Two Times A Day 18)  Jalyn 0.5-0.4 Mg Caps (Dutasteride-Tamsulosin Hcl) .... One By Mouth Daily  Allergies: 1)  ! * Dt  Past History:  Family History: Last updated: 02/05/2007 Family History of Prostate CA 1st degree relative <50 Family History Diabetes 1st degree relative Family History of CAD Male 1st degree relative <50  MID 109'S  Social History: Last updated: 02/05/2007 Domestic Partner Former Smoker Alcohol use-yes Drug use-no Regular exercise-yes  Risk Factors: Exercise: yes (02/05/2007)  Risk Factors: Smoking Status: quit (10/04/2009)  Past medical, surgical, family and social histories (including risk factors) reviewed, and no changes noted (except as noted below).  Past Medical History: Reviewed history from 02/05/2007 and no changes required. Diabetes mellitus, type II Hyperlipidemia Unremarkable Benign prostatic hypertrophy  Past Surgical History: Reviewed history  from 02/05/2007 and no changes required. FOOT SURGERY Sinus surgery Appendectomy  Family History: Reviewed history from 02/05/2007 and no changes required. Family History of Prostate CA 1st degree relative <50 Family History Diabetes 1st degree relative Family History of CAD Male 1st degree relative <50  MID 37'S  Social History: Reviewed history from 02/05/2007 and no changes required. Domestic Partner Former Smoker Alcohol use-yes Drug use-no Regular exercise-yes  Review of Systems  The  patient denies anorexia, fever, weight loss, weight gain, vision loss, decreased hearing, hoarseness, chest pain, syncope, dyspnea on exertion, peripheral edema, prolonged cough, headaches, hemoptysis, abdominal pain, melena, hematochezia, severe indigestion/heartburn, hematuria, incontinence, genital sores, muscle weakness, suspicious skin lesions, transient blindness, difficulty walking, depression, unusual weight change, abnormal bleeding, enlarged lymph nodes, angioedema, and breast masses.    Physical Exam  General:  Well-developed,well-nourished,in no acute distress; alert,appropriate and cooperative throughout examination Head:  Normocephalic and atraumatic without obvious abnormalities. No apparent alopecia or balding. Eyes:  pupils equal and pupils round.   Ears:  R ear normal and L ear normal.   Nose:  no external deformity and no nasal discharge.   Mouth:  good dentition and pharynx pink and moist.   Lungs:  normal respiratory effort and no wheezes.   Heart:  normal rate and regular rhythm.   Abdomen:  soft and no splenomegaly.   Msk:  No deformity or scoliosis noted of thoracic or lumbar spine.   Extremities:  trace left pedal edema and trace right pedal edema.   Neurologic:  alert & oriented X3 and cranial nerves II-XII intact.     Impression & Recommendations:  Problem # 1:  DIABETES MELLITUS, TYPE II (ICD-250.00) Assessment Deteriorated  The following medications were removed from the medication list:    Metformin Hcl 500 Mg Tabs (Metformin hcl) ..... One by mouth daily His updated medication list for this problem includes:    Bayer Low Strength 81 Mg Tbec (Aspirin) ..... Once daily    Kombiglyze Xr 09-998 Mg Xr24h-tab (Saxagliptin-metformin) ..... One by mouth daily    Lantus 100 Unit/ml Soln (Insulin glargine) .Marland Kitchen... 20 untis    Glimepiride 4 Mg Tabs (Glimepiride) ..... One by mouth daily  Labs Reviewed: Creat: 1.0 (12/05/2008)    Reviewed HgBA1c results: 8.7  (09/26/2009)  8.4 (06/27/2009)  Problem # 2:  HYPERLIPIDEMIA (ICD-272.4) Assessment: Unchanged  His updated medication list for this problem includes:    Lipitor 20 Mg Tabs (Atorvastatin calcium) ..... Once daily  Labs Reviewed: SGOT: 25 (03/07/2008)   SGPT: 24 (03/07/2008)  Lipid Goals: Chol Goal: 200 (06/14/2007)   HDL Goal: 40 (06/14/2007)   LDL Goal: 100 (06/14/2007)   TG Goal: 150 (06/14/2007)  10 Yr Risk Heart Disease: 9 % Prior 10 Yr Risk Heart Disease: 11 % (06/14/2007)   HDL:41.6 (03/07/2008), 38.6 (01/25/2007)  LDL:64 (03/07/2008), 76 (01/25/2007)  Chol:119 (03/07/2008), 130 (01/25/2007)  Trig:69 (03/07/2008), 78 (01/25/2007)  Problem # 3:  BENIGN PROSTATIC HYPERTROPHY (ICD-600.00) Assessment: Unchanged  The following medications were removed from the medication list:    Cardura 2 Mg Tabs (Doxazosin mesylate) .Marland Kitchen... At bedtime    Uroxatral 10 Mg Tb24 (Alfuzosin hcl) ..... Once daily    Avodart 0.5 Mg Caps (Dutasteride) ..... One by mouth daily His updated medication list for this problem includes:    Jalyn 0.5-0.4 Mg Caps (Dutasteride-tamsulosin hcl) ..... One by mouth daily  PSA: 4.01 (03/07/2008)     Complete Medication List: 1)  Lipitor 20 Mg Tabs (Atorvastatin calcium) .... Once daily 2)  Vitamin C 1000 Mg Tabs (Ascorbic acid) .... Once daily 3)  Vitamin D-1000 Max St [phone] Mg-unit Tabs (Calcium-vitamin d) .... Once daily 4)  Fish Oil 1000 Mg Caps (Omega-3 fatty acids) .... 4 capsules days 5)  Alpha Lop0ic 200 Mg  .... Once daily 6)  Saw Palmetto Extract 160 Mg Caps (Saw palmetto (serenoa repens)) .... 2 once daily 7)  Bayer Low Strength 81 Mg Tbec (Aspirin) .... Once daily 8)  Freestyle Lite Strp (Glucose blood) .... Use as directed bid 9)  Co Q-10 30-5 Mg-unit Caps (Coenzyme q10-vitamin e) .Marland Kitchen.. 1 once daily 10)  Kombiglyze Xr 09-998 Mg Xr24h-tab (Saxagliptin-metformin) .... One by mouth daily 11)  Lantus 100 Unit/ml Soln (Insulin glargine) .... 20  untis 12)  Calcium 1000 Mg  .Marland Kitchen.. 1 once daily 13)  Jalyn 0.5-0.4 Mg Caps (Dutasteride-tamsulosin hcl) .... One by mouth daily 14)  Glimepiride 4 Mg Tabs (Glimepiride) .... One by mouth daily  Lipid Assessment/Plan:      Based on NCEP/ATP III, the patient's risk factor category is "history of diabetes".  The patient's lipid goals are as follows: Total cholesterol goal is 200; LDL cholesterol goal is 100; HDL cholesterol goal is 40; Triglyceride goal is 150.    Patient Instructions: 1)  Please schedule a follow-up appointment in 6 weeks. 2)  stop the duetact and metformin 3)  replce witj kombigyze 09/998 and glyburide 4 Prescriptions: GLIMEPIRIDE 4 MG TABS (GLIMEPIRIDE) one by mouth daily  #30 x 11   Entered and Authorized by:   Stacie Glaze MD   Signed by:   Stacie Glaze MD on 10/04/2009   Method used:   Electronically to        [facility] (retail)       [address]       [place], [place]  [id number]       Ph: [phone]       Fax: [phone]   RxID:   [id number] GLIMEPIRIDE 4 MG TABS (GLIMEPIRIDE) one by mouth daily  #30 x 11   Entered and Authorized by:   Stacie Glaze MD   Signed by:   Stacie Glaze MD on 10/04/2009   Method used:   Electronically to        [facility]. [phone]* (retail)       [address]       [place], [place]  [address]       Ph: [phone]       Fax: [phone]   RxID:   [id number]

## 2010-06-06 NOTE — Letter (Signed)
Summary: [facility]  [facility]   Imported By: Maryln Gottron 06/27/2009 11:28:10  _____________________________________________________________________  External Attachment:    Type:   Image     Comment:   External Document

## 2010-08-19 ENCOUNTER — Other Ambulatory Visit: Payer: Self-pay | Admitting: Internal Medicine

## 2010-08-19 ENCOUNTER — Other Ambulatory Visit (INDEPENDENT_AMBULATORY_CARE_PROVIDER_SITE_OTHER): Payer: BC Managed Care – PPO

## 2010-08-19 DIAGNOSIS — E1165 Type 2 diabetes mellitus with hyperglycemia: Secondary | ICD-10-CM

## 2010-08-19 DIAGNOSIS — E1169 Type 2 diabetes mellitus with other specified complication: Secondary | ICD-10-CM

## 2010-08-19 LAB — BASIC METABOLIC PANEL
CO2: 28 mEq/L (ref 19–32)
Calcium: 9.1 mg/dL (ref 8.4–10.5)
Chloride: 103 mEq/L (ref 96–112)
Creatinine, Ser: 1 mg/dL (ref 0.4–1.5)
Glucose, Bld: 179 mg/dL — ABNORMAL HIGH (ref 70–99)

## 2010-08-26 ENCOUNTER — Encounter: Payer: Self-pay | Admitting: Internal Medicine

## 2010-08-26 ENCOUNTER — Ambulatory Visit (INDEPENDENT_AMBULATORY_CARE_PROVIDER_SITE_OTHER): Payer: BC Managed Care – PPO | Admitting: Internal Medicine

## 2010-08-26 VITALS — BP 130/80 | HR 76 | Temp 98.2°F | Resp 16 | Ht 77.0 in | Wt 212.0 lb

## 2010-08-26 DIAGNOSIS — E785 Hyperlipidemia, unspecified: Secondary | ICD-10-CM

## 2010-08-26 DIAGNOSIS — E119 Type 2 diabetes mellitus without complications: Secondary | ICD-10-CM

## 2010-08-26 MED ORDER — PIOGLITAZONE HCL 30 MG PO TABS: 30.0000 mg | ORAL_TABLET | Freq: Every day | ORAL | Status: DC

## 2010-08-26 MED ORDER — GLIMEPIRIDE 4 MG PO TABS: 4.0000 mg | ORAL_TABLET | ORAL | Status: DC

## 2010-08-26 NOTE — Patient Instructions (Signed)
We are going to try Actos and I have sent a prescription in ...  look on up [email or web address] and read about Actos and its warnings to make sure that you're comfortable with the drug I believe the percentage of people who experience the side effects is small and the risk of diabetes out of control is great.  If you find that your blood sugars start to drop again to cut back on the Lantus by 5 units at bedtime

## 2010-08-26 NOTE — Assessment & Plan Note (Signed)
If late for a meal has  Occasional hypoglycemia

## 2010-08-28 NOTE — Progress Notes (Signed)
  Subjective:    Patient ID: Corey Nielsen, male    DOB: 07-22-1945, 65 y.o.   MRN: [id number]  HPI patient is a 65 year old white male who has poorly controlled diabetes we have tried many interventions including the addition of a basal insulin to control his diabetes his A1c remains between 8 and 9.  In review of his past medical history it seems that he responded very well to [place] in the past when Avandia was withdrawn from the market we discontinued this medication on Avandia his A1c's were in the sevens and his blood glucose was in excellent control. Our options discussed with patient today included a pre-prandial insulin versus a trial of Actos. We spent over 30 minutes discussing the risks of Actos and put the package insert discusses about its potential side effects the patient is adverse to beginning pre-meal insulin and wishes to try a trial of Actos    Review of Systems  Constitutional: Negative for fever and fatigue.  HENT: Negative for hearing loss, congestion, neck pain and postnasal drip.   Eyes: Negative for discharge, redness and visual disturbance.  Respiratory: Negative for cough, shortness of breath and wheezing.   Cardiovascular: Negative for leg swelling.  Gastrointestinal: Negative for abdominal pain, constipation and abdominal distention.  Genitourinary: Negative for urgency and frequency.  Musculoskeletal: Negative for joint swelling and arthralgias.  Skin: Negative for color change and rash.  Neurological: Negative for weakness and light-headedness.  Hematological: Negative for adenopathy.  Psychiatric/Behavioral: Negative for behavioral problems.   Past Medical History  Diagnosis Date  . DIABETES MELLITUS, TYPE II 02/05/2007  . HYPERLIPIDEMIA 02/05/2007  . BENIGN PROSTATIC HYPERTROPHY 02/05/2007   Past Surgical History  Procedure Date  . Nasal sinus surgery 1989    obstructing growth ( Dr.  Dorisann Frames)  . Umbilical hernia repair 1996    Dr Earlene Plater  . Kidney  stone surgery 2007    reports that he has quit smoking. He does not have any smokeless tobacco history on file. He reports that he does not drink alcohol or use illicit drugs. family history includes Diabetes in his mother; Early death in his brother; and Heart disease in his father. Allergies  Allergen Reactions  . Tetanus Toxoids Shortness Of Breath    Horse serum       Objective:   Physical Exam  Constitutional: He is oriented to person, place, and time. He appears well-developed and well-nourished.  HENT:  Head: Normocephalic and atraumatic.  Eyes: Conjunctivae are normal. Pupils are equal, round, and reactive to light.  Neck: Normal range of motion. Neck supple.  Cardiovascular: Normal rate and regular rhythm.   Pulmonary/Chest: Effort normal and breath sounds normal.  Abdominal: Soft. Bowel sounds are normal.  Musculoskeletal: Normal range of motion.  Neurological: He is alert and oriented to person, place, and time.  Skin: Skin is warm and dry.          Assessment & Plan:  Poorly controlled diabetic with persistently high A1c's no current sequelae from his A1c elevation with good renal function and normal vision but I'm concerned about the persistence of these A1c's therefore after careful consideration and discussion with the patient we will add Actos 30 mg by mouth daily for 2 months trial he'll return with his glucometer monitored A1c and liver functions and we will decide together if this is appropriate course for him this at the addition of this medication fails then a sliding scale insulin with meals will be instituted

## 2010-09-20 NOTE — Procedures (Signed)
NAME:  Corey Nielsen, Corey Nielsen NO.:  [id number]   MEDICAL RECORD NO.:  [id number]          PATIENT TYPE:  OUT   LOCATION:  SLEEP CENTER                 FACILITY:  [facility]   PHYSICIAN:  Marcelyn Bruins, M.D. [facility] DATE OF BIRTH:  [birth year]   DATE OF STUDY:  04/25/2005                              NOCTURNAL POLYSOMNOGRAM   REFERRING PHYSICIAN:  Dr. Darryll Capers.   INDICATION FOR THE STUDY:  Persistent disorder of initiating and maintaining  sleep.   EPWORTH SLEEPINESS SCORE:  3.   SLEEP ARCHITECTURE:  The patient had a total sleep time of 323 minutes with  decreased slow wave sleep and REM.  Sleep onset latency was normal; however,  REM onset was prolonged.  Sleep efficiency was decreased at 78%.   RESPIRATORY DATA:  The patient was found to have 7 hypopneas and 20 apneas  for a respiratory disturbance index of 5 events per hour.  These occurred  primarily during REM.  There was moderate-to-severe snoring noted and large  numbers of nonspecific arousals which may be suggestive of the upper airway  resistant syndrome.   OXYGEN DATA:  The patient had an O2 desaturation as low as 90% with his  obstructive events.   CARDIAC DATA:  No clinically significant cardiac arrhythmias.   MOVEMENT/PARASOMNIA:  The patient was found to have 136 leg jerks with 3 per  hour resulting in arousal or awakening.   IMPRESSION/RECOMMENDATION:  1.  Very mild obstructive sleep apnea/hypopnea syndrome with a respiratory      disturbance index of 5 events per hour and O2 desaturation as low as      90%.  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  Clinical correlation is required to decide      whether the patient warrants treatment of this degree of sleep apnea.      The decision should be based primarily on lifestyle and how much this      may be impacting his quality  of life.  Treatment options may include      weight  loss alone if applicable, oral appliance, upper airway surgery,      and continuous positive airway pressure.  2.  Large numbers of leg jerks with mild-to-moderate sleep disruption.  Does      the patient have a history consistent with a restless leg syndrome or      periodic leg movement syndrome?           ______________________________  Marcelyn Bruins, M.D. [facility]  Diplomate, American Board of Sleep  Medicine     KC/MEDQ  D:  05/09/2005 10:06:48  T:  05/09/2005 11:45:59  Job:  [id number]

## 2010-09-20 NOTE — Op Note (Signed)
NAME:  JAVIN, NONG NO.:  [id number]   MEDICAL RECORD NO.:  [id number]          PATIENT TYPE:  AMB   LOCATION:  [facility]                         FACILITY:  [facility]   PHYSICIAN:  Valetta Fuller, M.D.  DATE OF BIRTH:  October 28, 1945   DATE OF PROCEDURE:  02/14/2004  DATE OF DISCHARGE:                                 OPERATIVE REPORT   PREOPERATIVE DIAGNOSIS:  Right distal ureteral calculus.   POSTOPERATIVE DIAGNOSIS:  Right distal ureteral calculus.   PROCEDURE PERFORMED:  Cystoscopy, retrograde pyelography, ureteroscopy,  stone basketing, and double J stent placement.   SURGEON:  Valetta Fuller, M.D.   ANESTHESIA:  General.   INDICATIONS:  Mr. Kushnir is a 65 year old male with no previous history of  nephrolithiasis.  He initially presented with asymptomatic gross hematuria  and was diagnosed with a proximal 4 mm right distal ureteral calculus.  He  has been unsuccessful at passing this stone for approximately one month.  He  has started to have intermittent episodes of fairly severe pain, and the  stone is now located in the distal ureter.  He requested intervention, which  we thought was appropriate given the time frame involved, and he seems to  understand the advantages and disadvantages of surgical intervention in this  setting.   TECHNIQUE AND FINDINGS:  The patient was brought to the operating room,  where he had successful induction of general anesthesia.  He was placed in  the lithotomy position and prepped and draped in the usual manner.  Cystoscopy revealed some mild trilobar hyperplasia and some slight  irritation and edema of the right transmural ureter.  The patient's right  ureteral orifice was very snug.  Retrograde pyelogram confirmed a filling  defect a couple of centimeters above the ureterovesical junction.  We had to  use a Glidewire to get past the stone, which appeared to be somewhat  impacted.  I used the inside portion of a ureteral access  sheath to provide  for some distal ureteral dilation over the guidewire.  The mini-6 [place]  short ureteroscope was then easily engaged in the distal ureter.  A 4 mm  stone was encountered, which was then basket-extracted without difficulty.  Because of the need for dilation as well as the inflammation and edema of  the ureteral mucosa, we elected to place a double J stent for four to five  days.  A 26 cm, 6 French stent was placed without difficulty utilizing  visual as well as fluoroscopic guidance.  A dangle string was secured to the  patient's penis.  The patient appeared to tolerate the procedure well.  There were no obvious complications.      DSG/MEDQ  D:  02/14/2004  T:  02/14/2004  Job:  [id number]

## 2010-10-03 ENCOUNTER — Other Ambulatory Visit: Payer: Self-pay | Admitting: Internal Medicine

## 2010-10-07 ENCOUNTER — Other Ambulatory Visit: Payer: Self-pay | Admitting: Internal Medicine

## 2010-10-08 ENCOUNTER — Other Ambulatory Visit: Payer: Self-pay | Admitting: *Deleted

## 2010-10-08 MED ORDER — ATORVASTATIN CALCIUM 20 MG PO TABS: 20.0000 mg | ORAL_TABLET | Freq: Every day | ORAL | Status: DC

## 2010-10-14 ENCOUNTER — Other Ambulatory Visit (INDEPENDENT_AMBULATORY_CARE_PROVIDER_SITE_OTHER): Payer: BC Managed Care – PPO

## 2010-10-14 ENCOUNTER — Other Ambulatory Visit: Payer: Self-pay | Admitting: Internal Medicine

## 2010-10-14 DIAGNOSIS — E119 Type 2 diabetes mellitus without complications: Secondary | ICD-10-CM

## 2010-10-21 ENCOUNTER — Ambulatory Visit (INDEPENDENT_AMBULATORY_CARE_PROVIDER_SITE_OTHER): Payer: BC Managed Care – PPO | Admitting: Internal Medicine

## 2010-10-21 VITALS — BP 130/80 | HR 76 | Temp 98.2°F | Resp 16 | Ht 77.0 in | Wt 214.0 lb

## 2010-10-21 DIAGNOSIS — E119 Type 2 diabetes mellitus without complications: Secondary | ICD-10-CM

## 2010-10-21 NOTE — Progress Notes (Signed)
  Subjective:    Patient ID: Corey Nielsen, male    DOB: 08/26/1945, 65 y.o.   MRN: [id number]  HPI  The pt has reduced the lantus to 25 with some hypoglycemia   Review of Systems  Constitutional: Negative for fever and fatigue.  HENT: Negative for hearing loss, congestion, neck pain and postnasal drip.   Eyes: Negative for discharge, redness and visual disturbance.  Respiratory: Negative for cough, shortness of breath and wheezing.   Cardiovascular: Negative for leg swelling.  Gastrointestinal: Negative for abdominal pain, constipation and abdominal distention.  Genitourinary: Negative for urgency and frequency.  Musculoskeletal: Negative for joint swelling and arthralgias.  Skin: Negative for color change and rash.  Neurological: Negative for weakness and light-headedness.  Hematological: Negative for adenopathy.  Psychiatric/Behavioral: Negative for behavioral problems.       Objective:   Physical Exam    Blood pressure 130/80, pulse 76, temperature 98.2 F (36.8 C), resp. rate 16, height 6\' 5"  (1.956 m), weight 214 lb (97.07 kg).      Assessment & Plan:  monitor a1c today

## 2010-11-22 ENCOUNTER — Other Ambulatory Visit: Payer: Self-pay | Admitting: *Deleted

## 2010-11-22 MED ORDER — SAXAGLIPTIN-METFORMIN ER 5-1000 MG PO TB24: 5.0000 mg | ORAL_TABLET | Freq: Every day | ORAL | Status: DC

## 2010-12-09 ENCOUNTER — Ambulatory Visit: Payer: BC Managed Care – PPO

## 2010-12-09 ENCOUNTER — Other Ambulatory Visit: Payer: Self-pay | Admitting: Internal Medicine

## 2010-12-09 DIAGNOSIS — E119 Type 2 diabetes mellitus without complications: Secondary | ICD-10-CM

## 2010-12-16 ENCOUNTER — Ambulatory Visit: Payer: BC Managed Care – PPO | Admitting: Internal Medicine

## 2010-12-18 ENCOUNTER — Encounter: Payer: Self-pay | Admitting: Internal Medicine

## 2010-12-18 ENCOUNTER — Ambulatory Visit (INDEPENDENT_AMBULATORY_CARE_PROVIDER_SITE_OTHER): Payer: BC Managed Care – PPO | Admitting: Internal Medicine

## 2010-12-18 DIAGNOSIS — E162 Hypoglycemia, unspecified: Secondary | ICD-10-CM

## 2010-12-18 DIAGNOSIS — E1165 Type 2 diabetes mellitus with hyperglycemia: Secondary | ICD-10-CM

## 2010-12-18 DIAGNOSIS — E1169 Type 2 diabetes mellitus with other specified complication: Secondary | ICD-10-CM

## 2010-12-18 MED ORDER — INSULIN LISPRO PROT & LISPRO (75-25 MIX) 100 UNIT/ML ~~LOC~~ SUSP: 8.0000 [IU] | Freq: Two times a day (BID) | SUBCUTANEOUS | Status: DC

## 2010-12-18 NOTE — Progress Notes (Signed)
  Subjective:    Patient ID: Corey Nielsen, male    DOB: July 12, 1945, 65 y.o.   MRN: [id number]  HPI  Corey Nielsen is a 65 year old white male followed for adult-onset diabetes and hyperlipidemia he's had very brittle to control diabetes BPH and he is on currently Lantus and 3 drugs.  He recently attempted to begin a low carbohydrate diet to control both his weight and his diabetes he has noticed increased episodes of hypoglycemia while on this low carbohydrate diet and he has reduced his dose of Lantus to 20 units.  He has lost weight with his diet his blood pressures stable his A1c checked prior to this visit has reduced to 8.1  Review of Systems  Constitutional: Negative for fever and fatigue.  HENT: Negative for hearing loss, congestion, neck pain and postnasal drip.   Eyes: Negative for discharge, redness and visual disturbance.  Respiratory: Negative for cough, shortness of breath and wheezing.   Cardiovascular: Negative for leg swelling.  Gastrointestinal: Negative for abdominal pain, constipation and abdominal distention.  Genitourinary: Negative for urgency and frequency.  Musculoskeletal: Negative for joint swelling and arthralgias.  Skin: Negative for color change and rash.  Neurological: Negative for weakness and light-headedness.  Hematological: Negative for adenopathy.  Psychiatric/Behavioral: Negative for behavioral problems.       Past Medical History  Diagnosis Date  . DIABETES MELLITUS, TYPE II 02/05/2007  . HYPERLIPIDEMIA 02/05/2007  . BENIGN PROSTATIC HYPERTROPHY 02/05/2007   Past Surgical History  Procedure Date  . Nasal sinus surgery 1989    obstructing growth ( Dr.  Dorisann Frames)  . Umbilical hernia repair 1996    Dr Earlene Plater  . Kidney stone surgery 2007    reports that he has quit smoking. He does not have any smokeless tobacco history on file. He reports that he does not drink alcohol or use illicit drugs. family history includes Diabetes in his mother; Early  death in his brother; and Heart disease in his father. Allergies  Allergen Reactions  . Tetanus Toxoids Shortness Of Breath    Horse serum    Objective:   Physical Exam  Nursing note and vitals reviewed. Constitutional: He appears well-developed and well-nourished.  HENT:  Head: Normocephalic and atraumatic.  Eyes: Conjunctivae are normal. Pupils are equal, round, and reactive to light.  Neck: Normal range of motion. Neck supple.  Cardiovascular: Normal rate and regular rhythm.   Pulmonary/Chest: Effort normal and breath sounds normal.  Abdominal: Soft. Bowel sounds are normal.          Assessment & Plan:  We discussed at length the plan for control of his diabetes with his low carbohydrate diet that he wishes to pursue we discussed the need of protein snacks and that he should schedule a least 6 feedings a day of a small amount.  Exercise should be compensated for with increased caloric intake to prevent hypoglycemia.  We will use a sliding scale of 7525 insulin before breakfast and before dinner as indicated by his CBGs sliding-scale was prepared for the patient.  If he is able to titrate off the insulin with his diet we have allowed him to do so using the sliding scale he will present in 2 months with an A1c and followup for this plan

## 2010-12-18 NOTE — Patient Instructions (Signed)
Following a low carbohydrate diet is a Idea but his friend was some concern as a diabetic that you are predisposed to hypoglycemia. It means that you to take a protein snack in between each meal. It should be 6 times a day in which you're eating something.

## 2011-02-05 LAB — GLUCOSE, CAPILLARY

## 2011-02-18 ENCOUNTER — Other Ambulatory Visit: Payer: Self-pay | Admitting: Internal Medicine

## 2011-02-24 ENCOUNTER — Ambulatory Visit: Payer: BC Managed Care – PPO

## 2011-02-24 DIAGNOSIS — E119 Type 2 diabetes mellitus without complications: Secondary | ICD-10-CM

## 2011-02-24 LAB — HEMOGLOBIN A1C: Hgb A1c MFr Bld: 7.8 % — ABNORMAL HIGH (ref 4.6–6.5)

## 2011-03-03 ENCOUNTER — Telehealth: Payer: Self-pay | Admitting: Internal Medicine

## 2011-03-03 ENCOUNTER — Ambulatory Visit (INDEPENDENT_AMBULATORY_CARE_PROVIDER_SITE_OTHER): Payer: BC Managed Care – PPO | Admitting: Internal Medicine

## 2011-03-03 ENCOUNTER — Encounter: Payer: Self-pay | Admitting: Internal Medicine

## 2011-03-03 DIAGNOSIS — E1165 Type 2 diabetes mellitus with hyperglycemia: Secondary | ICD-10-CM

## 2011-03-03 DIAGNOSIS — E1169 Type 2 diabetes mellitus with other specified complication: Secondary | ICD-10-CM

## 2011-03-03 DIAGNOSIS — E119 Type 2 diabetes mellitus without complications: Secondary | ICD-10-CM

## 2011-03-03 DIAGNOSIS — Z23 Encounter for immunization: Secondary | ICD-10-CM

## 2011-03-03 MED ORDER — INSULIN ASPART 100 UNIT/ML ~~LOC~~ SOLN: 4.0000 [IU] | Freq: Three times a day (TID) | SUBCUTANEOUS | Status: DC

## 2011-03-03 NOTE — Patient Instructions (Signed)
Patient was instructed to continue all medications as prescribed. To stop at the checkout desk and schedule a followup appointment  

## 2011-03-03 NOTE — Telephone Encounter (Signed)
Pt would like to have his future non fasting labs done @ [facility] @ 04-25-2011. Is this ok?

## 2011-03-03 NOTE — Progress Notes (Signed)
  Subjective:    Patient ID: Corey Nielsen, male    DOB: [birth year], 65 y.o.   MRN: [id number]  HPI Corey Nielsen has insulin requiring diabetes he was put on sliding scale 7030 insulin this had significant hypoglycemic events.  His A1c however did drop by 4 point with the start of mealtime insulin.  We discussed converting bedtime insulin to regular insulin and continue to follow up with the endocrinologist as planned   Review of Systems  Constitutional: Negative for fever and fatigue.  HENT: Negative for hearing loss, congestion, neck pain and postnasal drip.   Eyes: Negative for discharge, redness and visual disturbance.  Respiratory: Negative for cough, shortness of breath and wheezing.   Cardiovascular: Negative for leg swelling.  Gastrointestinal: Negative for abdominal pain, constipation and abdominal distention.  Genitourinary: Negative for urgency and frequency.  Musculoskeletal: Negative for joint swelling and arthralgias.  Skin: Negative for color change and rash.  Neurological: Negative for weakness and light-headedness.  Hematological: Negative for adenopathy.  Psychiatric/Behavioral: Negative for behavioral problems.   Past Medical History  Diagnosis Date  . DIABETES MELLITUS, TYPE II 02/05/2007  . HYPERLIPIDEMIA 02/05/2007  . BENIGN PROSTATIC HYPERTROPHY 02/05/2007   Past Surgical History  Procedure Date  . Nasal sinus surgery 1989    obstructing growth ( Dr.  Dorisann Frames)  . Umbilical hernia repair 1996    Dr Earlene Plater  . Kidney stone surgery 2007    reports that he has quit smoking. He does not have any smokeless tobacco history on file. He reports that he does not drink alcohol or use illicit drugs. family history includes Diabetes in his mother; Early death in his brother; and Heart disease in his father. Allergies  Allergen Reactions  . Tetanus Toxoids Shortness Of Breath    Horse serum        Objective:   Physical Exam  Nursing note and vitals  reviewed. Constitutional: He appears well-developed and well-nourished.  HENT:  Head: Normocephalic and atraumatic.  Eyes: Conjunctivae are normal. Pupils are equal, round, and reactive to light.  Neck: Normal range of motion. Neck supple.  Cardiovascular: Normal rate and regular rhythm.   Pulmonary/Chest: Effort normal and breath sounds normal.  Abdominal: Soft. Bowel sounds are normal.          Assessment & Plan:  Has been able to significantly effect the a1C with the 75/25 SSI The 8 units have been very uneven and there has been increased hypoglycemic episodes. The a1c 7.8  I have spent more than 30 minutes examining this patient face-to-face of which over half was spent in counseling

## 2011-03-03 NOTE — Telephone Encounter (Signed)
Bonnye, please enter future orders for pt to have labs drawn at [facility]

## 2011-03-03 NOTE — Telephone Encounter (Signed)
done

## 2011-03-31 ENCOUNTER — Other Ambulatory Visit: Payer: Self-pay | Admitting: *Deleted

## 2011-03-31 MED ORDER — INSULIN PEN NEEDLE 31G X 6 MM MISC: 31.0000 g | Freq: Every day | Status: DC

## 2011-05-02 ENCOUNTER — Ambulatory Visit: Payer: BC Managed Care – PPO | Admitting: Internal Medicine

## 2011-05-23 ENCOUNTER — Encounter: Payer: Self-pay | Admitting: Internal Medicine

## 2011-05-23 ENCOUNTER — Ambulatory Visit (INDEPENDENT_AMBULATORY_CARE_PROVIDER_SITE_OTHER): Payer: BC Managed Care – PPO | Admitting: Internal Medicine

## 2011-05-23 VITALS — BP 128/80 | Temp 98.2°F | Ht 77.0 in | Wt 218.0 lb

## 2011-05-23 DIAGNOSIS — E1169 Type 2 diabetes mellitus with other specified complication: Secondary | ICD-10-CM

## 2011-05-23 DIAGNOSIS — E1165 Type 2 diabetes mellitus with hyperglycemia: Secondary | ICD-10-CM

## 2011-05-23 DIAGNOSIS — I10 Essential (primary) hypertension: Secondary | ICD-10-CM

## 2011-05-23 NOTE — Patient Instructions (Signed)
The patient is instructed to continue all medications as prescribed. Schedule followup with check out clerk upon leaving the clinic  

## 2011-05-23 NOTE — Progress Notes (Signed)
Subjective:    Patient ID: Corey Nielsen, male    DOB: [birth year], 66 y.o.   MRN: [id number]  HPI  The endocrine consult agreed with the actos Blood pressure stable Added meal time insulin and lantus The pt desires a pump    Review of Systems  Constitutional: Negative for fever and fatigue.  HENT: Negative for hearing loss, congestion, neck pain and postnasal drip.   Eyes: Negative for discharge, redness and visual disturbance.  Respiratory: Negative for cough, shortness of breath and wheezing.   Cardiovascular: Negative for leg swelling.  Gastrointestinal: Negative for abdominal pain, constipation and abdominal distention.  Genitourinary: Negative for urgency and frequency.  Musculoskeletal: Negative for joint swelling and arthralgias.  Skin: Negative for color change and rash.  Neurological: Negative for weakness and light-headedness.  Hematological: Negative for adenopathy.  Psychiatric/Behavioral: Negative for behavioral problems.   Past Medical History  Diagnosis Date  . DIABETES MELLITUS, TYPE II 02/05/2007  . HYPERLIPIDEMIA 02/05/2007  . BENIGN PROSTATIC HYPERTROPHY 02/05/2007    History   Social History  . Marital Status: Single    Spouse Name: N/A    Number of Children: N/A  . Years of Education: N/A   Occupational History  . Not on file.   Social History Main Topics  . Smoking status: Former Games developer  . Smokeless tobacco: Not on file  . Alcohol Use: No  . Drug Use: No  . Sexually Active: Not on file   Other Topics Concern  . Not on file   Social History Narrative  . No narrative on file    Past Surgical History  Procedure Date  . Nasal sinus surgery 1989    obstructing growth ( Dr.  Dorisann Frames)  . Umbilical hernia repair 1996    Dr Earlene Plater  . Kidney stone surgery 2007    Family History  Problem Relation Age of Onset  . Diabetes Mother   . Heart disease Father   . Early death Brother     Allergies  Allergen Reactions  . Tetanus Toxoids  Shortness Of Breath    Horse serum    Current Outpatient Prescriptions on File Prior to Visit  Medication Sig Dispense Refill  . Ascorbic Acid (VITAMIN C) 1000 MG tablet Take 1,000 mg by mouth daily.        Marland Kitchen aspirin 81 MG tablet Take 81 mg by mouth daily.        Marland Kitchen atorvastatin (LIPITOR) 20 MG tablet Take 1 tablet (20 mg total) by mouth daily.  30 tablet  11  . glucose blood (FREESTYLE LITE) test strip 1 each by Other route. Use 3-4 times a day       . insulin aspart (NOVOLOG) 100 UNIT/ML injection Inject 4 Units into the skin 3 (three) times daily before meals. If the cbg is over 150 but under 250  increase to 6 If the cbg is over 250 increase to 8 If the cbg is under 150 keep with the 4 units  10 mL  12  . Insulin Pen Needle (ULTICARE MINI PEN NEEDLES) 31G X 6 MM MISC 31 g by Does not apply route daily.  100 each  11  . JALYN 0.5-0.4 MG CAPS TAKE ONE (1) CAPSULE EACH DAY  30 capsule  11  . Omega-3 Fatty Acids (FISH OIL) 1000 MG CAPS Take by mouth daily. 4 tabs every day       . pioglitazone (ACTOS) 30 MG tablet Take 1 tablet (30 mg total) by mouth daily.  30 tablet  11  . saw palmetto 160 MG capsule Take 160 mg by mouth 2 (two) times daily.        . Saxagliptin-Metformin (KOMBIGLYZE XR) 09-998 MG TB24 Take 5-1,000 mg by mouth daily.  30 tablet  11    BP 128/80  Temp(Src) 98.2 F (36.8 C) (Oral)  Ht 6\' 5"  (1.956 m)  Wt 218 lb (98.884 kg)  BMI 25.85 kg/m2        Objective:   Physical Exam  Nursing note and vitals reviewed. Constitutional: He appears well-developed and well-nourished.  HENT:  Head: Normocephalic and atraumatic.  Eyes: Conjunctivae are normal. Pupils are equal, round, and reactive to light.  Neck: Normal range of motion. Neck supple.  Cardiovascular: Normal rate and regular rhythm.   Pulmonary/Chest: Effort normal and breath sounds normal.  Abdominal: Soft. Bowel sounds are normal.          Assessment & Plan:  Carbohydrate app for phone to help manage  carbohydrates Refer to endocrinology for management of this difficult to control diabetic on multiple medications

## 2011-05-29 ENCOUNTER — Other Ambulatory Visit: Payer: Self-pay | Admitting: Internal Medicine

## 2011-06-09 ENCOUNTER — Other Ambulatory Visit: Payer: Self-pay | Admitting: Internal Medicine

## 2011-06-09 ENCOUNTER — Ambulatory Visit (INDEPENDENT_AMBULATORY_CARE_PROVIDER_SITE_OTHER)
Admission: RE | Admit: 2011-06-09 | Discharge: 2011-06-09 | Disposition: A | Discharge status: Home / Self Care | Payer: BC Managed Care – PPO | Source: Ambulatory Visit | Attending: Internal Medicine | Admitting: Internal Medicine

## 2011-06-09 DIAGNOSIS — M25561 Pain in right knee: Secondary | ICD-10-CM

## 2011-06-09 DIAGNOSIS — M25569 Pain in unspecified knee: Secondary | ICD-10-CM

## 2011-06-11 ENCOUNTER — Other Ambulatory Visit: Payer: Self-pay | Admitting: Internal Medicine

## 2011-06-11 DIAGNOSIS — M25561 Pain in right knee: Secondary | ICD-10-CM

## 2011-07-28 ENCOUNTER — Other Ambulatory Visit: Payer: Self-pay | Admitting: Internal Medicine

## 2011-08-22 ENCOUNTER — Ambulatory Visit: Payer: BC Managed Care – PPO | Admitting: Internal Medicine

## 2011-10-03 ENCOUNTER — Ambulatory Visit (INDEPENDENT_AMBULATORY_CARE_PROVIDER_SITE_OTHER): Payer: Medicare Other | Admitting: Internal Medicine

## 2011-10-03 ENCOUNTER — Encounter: Payer: Self-pay | Admitting: Internal Medicine

## 2011-10-03 VITALS — BP 110/70 | HR 76 | Temp 98.7°F | Resp 16 | Ht 77.0 in | Wt 218.0 lb

## 2011-10-03 DIAGNOSIS — E119 Type 2 diabetes mellitus without complications: Secondary | ICD-10-CM

## 2011-10-03 DIAGNOSIS — E785 Hyperlipidemia, unspecified: Secondary | ICD-10-CM

## 2011-10-03 DIAGNOSIS — T887XXA Unspecified adverse effect of drug or medicament, initial encounter: Secondary | ICD-10-CM

## 2011-10-03 NOTE — Progress Notes (Signed)
Addended by: Rita Ohara R on: 10/03/2011 11:20 AM   Modules accepted: Orders

## 2011-10-03 NOTE — Patient Instructions (Signed)
The patient is instructed to continue all medications as prescribed. Schedule followup with check out clerk upon leaving the clinic  

## 2011-10-03 NOTE — Progress Notes (Signed)
Subjective:    Patient ID: Corey Nielsen, male    DOB: 06/20/1945, 66 y.o.   MRN: [id number]  HPI Follow up for the insulin pump Has been doing well. Much more improved glucose control A1c has been improved On oral agents as well.... No plans for changing oral agents.    Review of Systems  Constitutional: Negative for fever and fatigue.  HENT: Negative for hearing loss, congestion, neck pain and postnasal drip.   Eyes: Negative for discharge, redness and visual disturbance.  Respiratory: Negative for cough, shortness of breath and wheezing.   Cardiovascular: Negative for leg swelling.  Gastrointestinal: Negative for abdominal pain, constipation and abdominal distention.  Genitourinary: Negative for urgency and frequency.  Musculoskeletal: Negative for joint swelling and arthralgias.  Skin: Negative for color change and rash.  Neurological: Negative for weakness and light-headedness.  Hematological: Negative for adenopathy.  Psychiatric/Behavioral: Negative for behavioral problems.   Past Medical History  Diagnosis Date  . DIABETES MELLITUS, TYPE II 02/05/2007  . HYPERLIPIDEMIA 02/05/2007  . BENIGN PROSTATIC HYPERTROPHY 02/05/2007    History   Social History  . Marital Status: Single    Spouse Name: N/A    Number of Children: N/A  . Years of Education: N/A   Occupational History  . Not on file.   Social History Main Topics  . Smoking status: Former Games developer  . Smokeless tobacco: Not on file  . Alcohol Use: No  . Drug Use: No  . Sexually Active: Not on file   Other Topics Concern  . Not on file   Social History Narrative  . No narrative on file    Past Surgical History  Procedure Date  . Nasal sinus surgery 1989    obstructing growth ( Dr.  Dorisann Frames)  . Umbilical hernia repair 1996    Dr Earlene Plater  . Kidney stone surgery 2007    Family History  Problem Relation Age of Onset  . Diabetes Mother   . Heart disease Father   . Early death Brother      Allergies  Allergen Reactions  . Tetanus Toxoids Shortness Of Breath    Horse serum    Current Outpatient Prescriptions on File Prior to Visit  Medication Sig Dispense Refill  . Ascorbic Acid (VITAMIN C) 1000 MG tablet Take 1,000 mg by mouth daily.        Marland Kitchen aspirin 81 MG tablet Take 81 mg by mouth daily.        Marland Kitchen atorvastatin (LIPITOR) 20 MG tablet Take 1 tablet (20 mg total) by mouth daily.  30 tablet  11  . FREESTYLE LITE test strip TEST BLOOD GLUCOSE 3-4 TIMES DAILY  100 each  6  . Insulin Human (INSULIN PUMP) 100 unit/ml SOLN Inject into the skin. Insulin pump with novolog insulin      . JALYN 0.5-0.4 MG CAPS TAKE ONE (1) CAPSULE EACH DAY  30 capsule  11  . Omega-3 Fatty Acids (FISH OIL) 1000 MG CAPS Take by mouth daily. 4 tabs every day       . pioglitazone (ACTOS) 30 MG tablet TAKE 1 TABLET EACH DAY  30 tablet  3  . saw palmetto 160 MG capsule Take 160 mg by mouth 2 (two) times daily.        . Saxagliptin-Metformin (KOMBIGLYZE XR) 09-998 MG TB24 Take 5-1,000 mg by mouth daily.  30 tablet  11    BP 110/70  Pulse 76  Temp 98.7 F (37.1 C)  Resp 16  Ht  6\' 5"  (1.956 m)  Wt 218 lb (98.884 kg)  BMI 25.85 kg/m2       Objective:   Physical Exam  Nursing note and vitals reviewed. Constitutional: He appears well-developed and well-nourished.  HENT:  Head: Normocephalic and atraumatic.  Eyes: Conjunctivae are normal. Pupils are equal, round, and reactive to light.  Neck: Normal range of motion. Neck supple.  Cardiovascular: Normal rate and regular rhythm.   Pulmonary/Chest: Effort normal and breath sounds normal.  Abdominal: Soft. Bowel sounds are normal.          Assessment & Plan:  The patients is follow by endocrinology for insulin pumps Monitoring of lipids and liver discussion of current medications.

## 2011-10-12 ENCOUNTER — Other Ambulatory Visit: Payer: Self-pay | Admitting: Internal Medicine

## 2011-10-16 ENCOUNTER — Other Ambulatory Visit (INDEPENDENT_AMBULATORY_CARE_PROVIDER_SITE_OTHER): Payer: Medicare Other

## 2011-10-16 DIAGNOSIS — T887XXA Unspecified adverse effect of drug or medicament, initial encounter: Secondary | ICD-10-CM

## 2011-10-16 DIAGNOSIS — E785 Hyperlipidemia, unspecified: Secondary | ICD-10-CM

## 2011-10-16 DIAGNOSIS — E119 Type 2 diabetes mellitus without complications: Secondary | ICD-10-CM

## 2011-10-16 LAB — HEPATIC FUNCTION PANEL
AST: 19 U/L (ref 0–37)
Albumin: 3.7 g/dL (ref 3.5–5.2)
Alkaline Phosphatase: 47 U/L (ref 39–117)
Bilirubin, Direct: 0.2 mg/dL (ref 0.0–0.3)

## 2011-10-16 LAB — LIPID PANEL
HDL: 48.1 mg/dL (ref >39.00)
LDL Cholesterol: 62 mg/dL (ref 0–99)
Total CHOL/HDL Ratio: 3
VLDL: 18.6 mg/dL (ref 0.0–40.0)

## 2011-10-16 LAB — HEMOGLOBIN A1C: Hgb A1c MFr Bld: 8.1 % — ABNORMAL HIGH (ref 4.6–6.5)

## 2011-11-15 ENCOUNTER — Other Ambulatory Visit: Payer: Self-pay | Admitting: Internal Medicine

## 2011-11-24 ENCOUNTER — Other Ambulatory Visit: Payer: Self-pay | Admitting: Internal Medicine

## 2011-12-16 ENCOUNTER — Other Ambulatory Visit: Payer: Self-pay | Admitting: Internal Medicine

## 2011-12-31 ENCOUNTER — Other Ambulatory Visit: Payer: Self-pay | Admitting: *Deleted

## 2011-12-31 MED ORDER — PIOGLITAZONE HCL 30 MG PO TABS: 30.0000 mg | ORAL_TABLET | Freq: Every day | ORAL | Status: DC

## 2012-01-16 ENCOUNTER — Other Ambulatory Visit: Payer: Self-pay | Admitting: Internal Medicine

## 2012-02-20 ENCOUNTER — Ambulatory Visit (INDEPENDENT_AMBULATORY_CARE_PROVIDER_SITE_OTHER): Payer: Medicare Other

## 2012-02-20 DIAGNOSIS — Z23 Encounter for immunization: Secondary | ICD-10-CM

## 2012-03-15 ENCOUNTER — Other Ambulatory Visit: Payer: Self-pay | Admitting: Internal Medicine

## 2012-05-26 ENCOUNTER — Other Ambulatory Visit: Payer: Self-pay | Admitting: *Deleted

## 2012-06-03 ENCOUNTER — Other Ambulatory Visit: Payer: Self-pay | Admitting: Internal Medicine

## 2012-09-30 ENCOUNTER — Other Ambulatory Visit: Payer: Self-pay | Admitting: *Deleted

## 2012-09-30 MED ORDER — ATORVASTATIN CALCIUM 20 MG PO TABS: ORAL_TABLET | ORAL | Status: DC

## 2012-09-30 MED ORDER — PIOGLITAZONE HCL 30 MG PO TABS: 30.0000 mg | ORAL_TABLET | Freq: Every day | ORAL | Status: AC

## 2012-09-30 MED ORDER — SAXAGLIPTIN-METFORMIN ER 5-1000 MG PO TB24: ORAL_TABLET | ORAL | Status: DC

## 2012-09-30 MED ORDER — DUTASTERIDE-TAMSULOSIN HCL 0.5-0.4 MG PO CAPS: ORAL_CAPSULE | ORAL | Status: DC

## 2013-06-15 ENCOUNTER — Ambulatory Visit: Payer: PRIVATE HEALTH INSURANCE | Admitting: Internal Medicine

## 2013-07-29 ENCOUNTER — Encounter: Payer: Self-pay | Admitting: Internal Medicine

## 2013-10-21 ENCOUNTER — Other Ambulatory Visit: Payer: PRIVATE HEALTH INSURANCE

## 2013-10-28 ENCOUNTER — Encounter: Payer: PRIVATE HEALTH INSURANCE | Admitting: Internal Medicine

## 2013-12-16 ENCOUNTER — Other Ambulatory Visit: Payer: Self-pay | Admitting: Internal Medicine

## 2014-03-23 ENCOUNTER — Other Ambulatory Visit: Payer: Self-pay | Admitting: Gastroenterology

## 2014-05-15 ENCOUNTER — Encounter (HOSPITAL_COMMUNITY): Payer: Self-pay | Admitting: *Deleted

## 2014-05-16 ENCOUNTER — Other Ambulatory Visit: Payer: Self-pay | Admitting: Gastroenterology

## 2014-05-27 NOTE — Anesthesia Preprocedure Evaluation (Addendum)
Anesthesia Evaluation  Patient identified by MRN, date of birth, ID band Patient awake    Reviewed: Allergy & Precautions, NPO status , Patient's Chart, lab work & pertinent test results, reviewed documented beta blocker date and time   Airway Mallampati: II       Dental  (+) Teeth Intact, Dental Advisory Given   Pulmonary former smoker (quit 1988 12 pack years),  breath sounds clear to auscultation        Cardiovascular negative cardio ROS  Rhythm:Regular     Neuro/Psych  Headaches, negative psych ROS   GI/Hepatic negative GI ROS, Neg liver ROS,   Endo/Other  diabetes, Type 2, Insulin Dependent  Renal/GU negative Renal ROS     Musculoskeletal negative musculoskeletal ROS (+)   Abdominal (+)  Abdomen: soft.    Peds  Hematology negative hematology ROS (+)   Anesthesia Other Findings   Reproductive/Obstetrics                            Anesthesia Physical Anesthesia Plan  ASA: II  Anesthesia Plan: MAC   Post-op Pain Management:    Induction: Intravenous  Airway Management Planned: Nasal Cannula  Additional Equipment:   Intra-op Plan:   Post-operative Plan:   Informed Consent: I have reviewed the patients History and Physical, chart, labs and discussed the procedure including the risks, benefits and alternatives for the proposed anesthesia with the patient or authorized representative who has indicated his/her understanding and acceptance.     Plan Discussed with:   Anesthesia Plan Comments:         Anesthesia Quick Evaluation

## 2014-05-29 ENCOUNTER — Ambulatory Visit (HOSPITAL_COMMUNITY)
Admission: RE | Admit: 2014-05-29 | Discharge: 2014-05-29 | Disposition: A | Discharge status: Home / Self Care | Payer: Medicare Other | Source: Ambulatory Visit | Attending: Gastroenterology | Admitting: Gastroenterology

## 2014-05-29 ENCOUNTER — Ambulatory Visit (HOSPITAL_COMMUNITY): Payer: Medicare Other | Admitting: Anesthesiology

## 2014-05-29 ENCOUNTER — Encounter (HOSPITAL_COMMUNITY)
Admission: RE | Disposition: A | Discharge status: Home / Self Care | Payer: Self-pay | Source: Ambulatory Visit | Attending: Gastroenterology

## 2014-05-29 ENCOUNTER — Encounter (HOSPITAL_COMMUNITY): Payer: Self-pay | Admitting: Anesthesiology

## 2014-05-29 DIAGNOSIS — Z1211 Encounter for screening for malignant neoplasm of colon: Secondary | ICD-10-CM | POA: Insufficient documentation

## 2014-05-29 DIAGNOSIS — D123 Benign neoplasm of transverse colon: Secondary | ICD-10-CM | POA: Diagnosis not present

## 2014-05-29 DIAGNOSIS — N400 Enlarged prostate without lower urinary tract symptoms: Secondary | ICD-10-CM | POA: Insufficient documentation

## 2014-05-29 DIAGNOSIS — E780 Pure hypercholesterolemia: Secondary | ICD-10-CM | POA: Diagnosis not present

## 2014-05-29 DIAGNOSIS — Z87891 Personal history of nicotine dependence: Secondary | ICD-10-CM | POA: Diagnosis not present

## 2014-05-29 DIAGNOSIS — E1142 Type 2 diabetes mellitus with diabetic polyneuropathy: Secondary | ICD-10-CM | POA: Insufficient documentation

## 2014-05-29 DIAGNOSIS — Z7982 Long term (current) use of aspirin: Secondary | ICD-10-CM | POA: Diagnosis not present

## 2014-05-29 DIAGNOSIS — Z794 Long term (current) use of insulin: Secondary | ICD-10-CM | POA: Diagnosis not present

## 2014-05-29 HISTORY — DX: Headache, unspecified: R51.9

## 2014-05-29 HISTORY — DX: Headache: R51

## 2014-05-29 HISTORY — PX: COLONOSCOPY WITH PROPOFOL: SHX5780

## 2014-05-29 SURGERY — COLONOSCOPY WITH PROPOFOL
Anesthesia: Monitor Anesthesia Care

## 2014-05-29 MED ORDER — LACTATED RINGERS IV SOLN
INTRAVENOUS | Status: DC | PRN
  Administered 2014-05-29: 08:00:00 via INTRAVENOUS

## 2014-05-29 MED ORDER — MEPERIDINE HCL 100 MG/ML IJ SOLN: 6.2500 mg | INTRAMUSCULAR | Status: DC | PRN

## 2014-05-29 MED ORDER — ONDANSETRON HCL 4 MG/2ML IJ SOLN
INTRAMUSCULAR | Status: DC | PRN
  Administered 2014-05-29: 4 mg via INTRAVENOUS

## 2014-05-29 MED ORDER — KETAMINE HCL 10 MG/ML IJ SOLN
INTRAMUSCULAR | Status: DC | PRN
  Administered 2014-05-29: 20 mg via INTRAVENOUS

## 2014-05-29 MED ORDER — SODIUM CHLORIDE 0.9 % IV SOLN: INTRAVENOUS | Status: DC

## 2014-05-29 MED ORDER — PROPOFOL INFUSION 10 MG/ML OPTIME
INTRAVENOUS | Status: DC | PRN
  Administered 2014-05-29: 200 ug/kg/min via INTRAVENOUS

## 2014-05-29 MED ORDER — PROMETHAZINE HCL 25 MG/ML IJ SOLN
6.2500 mg | INTRAMUSCULAR | Status: DC | PRN
Start: 2014-05-29 — End: 2014-05-29

## 2014-05-29 MED ORDER — PROPOFOL 10 MG/ML IV BOLUS
INTRAVENOUS | Status: AC
  Filled 2014-05-29: qty 20

## 2014-05-29 MED ORDER — FENTANYL CITRATE 0.05 MG/ML IJ SOLN: 25.0000 ug | INTRAMUSCULAR | Status: DC | PRN

## 2014-05-29 SURGICAL SUPPLY — 22 items

## 2014-05-29 NOTE — Transfer of Care (Signed)
Immediate Anesthesia Transfer of Care Note  Patient: Corey Nielsen  Procedure(s) Performed: Procedure(s): COLONOSCOPY WITH PROPOFOL (N/A)  Patient Location: PACU  Anesthesia Type:MAC  Level of Consciousness: awake, alert  and oriented  Airway & Oxygen Therapy: Patient Spontanous Breathing and Patient connected to face mask oxygen  Post-op Assessment: Report given to PACU RN  Post vital signs: Reviewed and stable  Complications: No apparent anesthesia complications

## 2014-05-29 NOTE — H&P (Signed)
  Procedure: Baseline screening colonoscopy  History: The patient is a 69 year old male born Jul 07, 1945. He is scheduled to undergo his first screening colonoscopy with polypectomy to prevent colon cancer.  Medication allergies: Tetanus vaccine caused dyspnea  Past medical history: Type 2 diabetes mellitus. Diabetic peripheral neuropathy. Hypercholesterolemia. Benign prostatic hypertrophy. Sinus surgery. Umbilical hernia repair. Kidney stone surgery.  Family history: Negative for colon cancer  Exam: The patient is alert and lying comfortably on the endoscopy stretcher. Abdomen is soft and nontender to palpation. Lungs are clear to auscultation. Cardiac exam reveals a regular rhythm.  Plan: Proceed with baseline screening colonoscopy

## 2014-05-29 NOTE — Discharge Instructions (Signed)

## 2014-05-29 NOTE — Anesthesia Postprocedure Evaluation (Signed)
  Anesthesia Post-op Note  Patient: Corey Nielsen  Procedure(s) Performed: Procedure(s): COLONOSCOPY WITH PROPOFOL (N/A)  Patient Location: PACU  Anesthesia Type:MAC  Level of Consciousness: awake and alert   Airway and Oxygen Therapy: Patient Spontanous Breathing and Patient connected to nasal cannula oxygen  Post-op Pain: none  Post-op Assessment: Post-op Vital signs reviewed and Patient's Cardiovascular Status Stable  Post-op Vital Signs: Reviewed and stable  Last Vitals:  Filed Vitals:   05/29/14 0855  BP: 129/74  Pulse:   Temp:   Resp:     Complications: No apparent anesthesia complications

## 2014-05-29 NOTE — Op Note (Signed)
Procedure: Baseline screening colonoscopy  Endoscopist: Earle Gell  Premedication: Propofol administered by anesthesia  Procedure: The patient was placed in the left lateral decubitus position. Anal inspection and digital rectal exam were normal. The Pentax pediatric colonoscope was introduced into the rectum and advanced to the cecum. A normal-appearing appendiceal orifice was identified. A normal-appearing ileocecal valve was identified. Colonic preparation for the exam today was good after vigorous water irrigation of the colon. Withdrawal time was 26 minutes  Rectum. Normal. Retroflexed view of the distal rectum normal  Sigmoid colon and descending colon. Normal  Splenic flexure. Normal  Transverse colon. From the distal transverse colon a 5 mm sessile polyp was removed with the cold snare  Hepatic flexure. Normal  Ascending colon. Normal  Cecum and ileocecal valve. Normal  Assessment: A 5 mm sessile polyp was removed from the distal transverse colon. Otherwise normal colonoscopy  Recommendation: If the distal transverse colon polyp returns adenomatous pathologically, the patient should undergo a surveillance colonoscopy in 5 years.

## 2014-05-30 ENCOUNTER — Encounter (HOSPITAL_COMMUNITY): Payer: Self-pay | Admitting: Gastroenterology

## 2014-05-30 LAB — GLUCOSE, CAPILLARY: Glucose-Capillary: 98 mg/dL (ref 70–99)

## 2014-08-02 ENCOUNTER — Other Ambulatory Visit: Payer: Self-pay | Admitting: Dermatology

## 2014-12-25 ENCOUNTER — Other Ambulatory Visit: Payer: Self-pay | Admitting: Internal Medicine

## 2014-12-25 DIAGNOSIS — Z0000 Encounter for general adult medical examination without abnormal findings: Secondary | ICD-10-CM

## 2014-12-27 ENCOUNTER — Ambulatory Visit
Admission: RE | Admit: 2014-12-27 | Discharge: 2014-12-27 | Disposition: A | Discharge status: Home / Self Care | Payer: Medicare Other | Source: Ambulatory Visit | Attending: Internal Medicine | Admitting: Internal Medicine

## 2014-12-27 ENCOUNTER — Other Ambulatory Visit: Payer: Self-pay | Admitting: Internal Medicine

## 2014-12-27 DIAGNOSIS — M79671 Pain in right foot: Secondary | ICD-10-CM

## 2014-12-27 DIAGNOSIS — Z0000 Encounter for general adult medical examination without abnormal findings: Secondary | ICD-10-CM

## 2015-01-04 ENCOUNTER — Other Ambulatory Visit: Payer: Self-pay | Admitting: Internal Medicine

## 2015-01-04 DIAGNOSIS — R609 Edema, unspecified: Secondary | ICD-10-CM

## 2015-01-05 ENCOUNTER — Ambulatory Visit
Admission: RE | Admit: 2015-01-05 | Discharge: 2015-01-05 | Disposition: A | Discharge status: Home / Self Care | Payer: Medicare Other | Source: Ambulatory Visit | Attending: Internal Medicine | Admitting: Internal Medicine

## 2015-01-05 DIAGNOSIS — R609 Edema, unspecified: Secondary | ICD-10-CM

## 2015-11-12 ENCOUNTER — Encounter: Payer: Self-pay | Admitting: Podiatry

## 2015-11-12 ENCOUNTER — Ambulatory Visit (INDEPENDENT_AMBULATORY_CARE_PROVIDER_SITE_OTHER): Payer: Medicare Other

## 2015-11-12 ENCOUNTER — Ambulatory Visit (INDEPENDENT_AMBULATORY_CARE_PROVIDER_SITE_OTHER): Payer: Medicare Other | Admitting: Podiatry

## 2015-11-12 ENCOUNTER — Ambulatory Visit: Payer: Self-pay

## 2015-11-12 VITALS — BP 147/86 | HR 64 | Resp 16 | Ht 77.0 in | Wt 220.0 lb

## 2015-11-12 DIAGNOSIS — M79671 Pain in right foot: Secondary | ICD-10-CM | POA: Diagnosis not present

## 2015-11-12 DIAGNOSIS — M779 Enthesopathy, unspecified: Secondary | ICD-10-CM

## 2015-11-12 DIAGNOSIS — M79672 Pain in left foot: Secondary | ICD-10-CM | POA: Diagnosis not present

## 2015-11-12 NOTE — Progress Notes (Signed)
   Subjective:    Patient ID: Corey Nielsen, male    DOB: 26-Aug-1945, 70 y.o.   MRN: [id number]  HPI Chief Complaint  Patient presents with  . Ankle Pain    Right foot; both sides; pt stated, "Hears popping sounds when walking"; x1  yr  . Foot Pain    Right foot; arch & dorsal; since November 19, 2014; Diabetic Type 2; sugar=142 this am; A1C=Getting tested tomorrow      Review of Systems  Cardiovascular: Positive for leg swelling.  Hematological: Bruises/bleeds easily.  All other systems reviewed and are negative.      Objective:   Physical Exam        Assessment & Plan:

## 2015-11-13 NOTE — Progress Notes (Signed)
Subjective:     Patient ID: Corey Nielsen, male   DOB: 10-09-1945, 70 y.o.   MRN: [id number]  HPI patient presents stating both of his feet are flat and they can bother him and he need some kind of inserts to lift them. States the right has been worse than the left   Review of Systems  All other systems reviewed and are negative.      Objective:   Physical Exam  Constitutional: He is oriented to person, place, and time.  Cardiovascular: Intact distal pulses.   Musculoskeletal: Normal range of motion.  Neurological: He is oriented to person, place, and time.  Skin: Skin is warm.  Nursing note and vitals reviewed.  neurovascular status intact muscle strength adequate range of motion within normal limits with patient found to have quite a bit of flatfoot deformity right over left with posterior tibial bulging and discomfort when palpated. 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 Reviewed supportive shoe gear usage not going barefoot and reappoint for orthotics. Port indicated significant depression of the arch right over left with collapse medial longitudinal arch

## 2015-12-04 ENCOUNTER — Ambulatory Visit: Payer: Medicare Other | Admitting: *Deleted

## 2015-12-04 DIAGNOSIS — M779 Enthesopathy, unspecified: Secondary | ICD-10-CM

## 2015-12-04 NOTE — Progress Notes (Signed)
Patient presents for orthotic pick up.  Verbal and written break in and wear instructions given.  Patient will follow up in 4 weeks if symptoms worsen or fail to improve. 

## 2015-12-04 NOTE — Patient Instructions (Signed)

## 2016-01-25 ENCOUNTER — Encounter: Payer: Self-pay | Admitting: Podiatry

## 2016-01-25 ENCOUNTER — Ambulatory Visit (INDEPENDENT_AMBULATORY_CARE_PROVIDER_SITE_OTHER): Payer: Medicare Other | Admitting: Podiatry

## 2016-01-25 DIAGNOSIS — M722 Plantar fascial fibromatosis: Secondary | ICD-10-CM

## 2016-01-25 MED ORDER — TRIAMCINOLONE ACETONIDE 10 MG/ML IJ SUSP: 10.0000 mg | Freq: Once | INTRAMUSCULAR | Status: DC

## 2016-01-28 NOTE — Progress Notes (Signed)
Subjective:     Patient ID: Corey Nielsen, male   DOB: 06-13-1945, 70 y.o.   MRN: [id number]  HPI patient states his orthotics bother him some but he is having pain in his right heel and he's not sure if it's related orthotics or a different problem   Review of Systems     Objective:   Physical Exam Neurovascular status intact muscle strength adequate and inflammation pain in the right plantar heel at the insertional point tendon into the calcaneus with fluid buildup around the medial band    Assessment:     Plantar fasciitis right inflammation fluid noted around the medial band    Plan:     H&P condition reviewed and injected the plantar fascia right 3 mg Kenalog 5 mg Xylocaine and gave instructions on physical therapy continued orthotic usage and discussed we may modify them if they remain a problem

## 2016-02-22 ENCOUNTER — Ambulatory Visit: Payer: Medicare Other | Admitting: Podiatry

## 2016-02-25 ENCOUNTER — Ambulatory Visit (INDEPENDENT_AMBULATORY_CARE_PROVIDER_SITE_OTHER): Payer: Medicare Other | Admitting: Podiatry

## 2016-02-25 DIAGNOSIS — M722 Plantar fascial fibromatosis: Secondary | ICD-10-CM

## 2016-02-25 MED ORDER — TRIAMCINOLONE ACETONIDE 10 MG/ML IJ SUSP
10.0000 mg | Freq: Once | INTRAMUSCULAR | Status: AC
  Administered 2016-02-25: 10 mg

## 2016-02-25 NOTE — Progress Notes (Signed)
Subjective:     Patient ID: Corey Nielsen, male   DOB: [birth year], 70 y.o.   MRN: [id number]  HPI patient states she still having pain in his right arch but it is more distal and within the middle and is very sore when pressed   Review of Systems     Objective:   Physical Exam Neurovascular status intact with mid arch inflammation pain that is very tender when pressed and is distal to the calcaneus    Assessment:     Inflammatory fasciitis    Plan:     Injected more distal 3 mg Kenalog 5 mg Xylocaine and dispensed night splint with all instructions on usage and we will try to re-aligned his orthotics

## 2016-03-17 ENCOUNTER — Ambulatory Visit: Payer: Medicare Other

## 2016-03-20 ENCOUNTER — Ambulatory Visit (INDEPENDENT_AMBULATORY_CARE_PROVIDER_SITE_OTHER): Payer: Medicare Other | Admitting: Podiatry

## 2016-03-20 DIAGNOSIS — M722 Plantar fascial fibromatosis: Secondary | ICD-10-CM

## 2016-03-20 DIAGNOSIS — M779 Enthesopathy, unspecified: Secondary | ICD-10-CM

## 2016-03-20 NOTE — Patient Instructions (Signed)

## 2016-03-20 NOTE — Progress Notes (Signed)
Pt presents to [facility], only Rt orthotic was shipped back to [place] after alterations were done. Pt stated he did not have the Lt orthotic. I emailed [employer] to make and send a left orthotic on rush order and we will call him when it comes in.

## 2016-04-09 ENCOUNTER — Ambulatory Visit: Payer: Medicare Other

## 2016-04-09 DIAGNOSIS — M722 Plantar fascial fibromatosis: Secondary | ICD-10-CM

## 2016-04-09 NOTE — Patient Instructions (Signed)

## 2016-04-10 NOTE — Progress Notes (Signed)
Patient presents for orthotic pick up of Lt insert only  Verbal and written break in and wear instructions given.  Patient will follow up in 4 weeks if symptoms worsen or fail to improve.

## 2017-05-04 IMAGING — US US EXTREM LOW VENOUS*R*
1 series · 13 of 24 positions shown · non-contrast
Comparison: None.

CLINICAL DATA: RIGHT lower extremity swelling.

EXAM:
RIGHT LOWER EXTREMITY VENOUS DOPPLER ULTRASOUND
CLINICAL DATA: Left lower extremity swelling.
LEFT LOWER EXTREMITY VENOUS DOPPLER ULTRASOUND
TECHNIQUE: Gray-scale sonography with graded compression, as well as color
Doppler and duplex ultrasound were performed to evaluate the lower
extremity deep venous systems from the level of the common femoral
vein and including the common femoral, femoral, profunda femoral,
popliteal and calf veins including the posterior tibial, peroneal
and gastrocnemius veins when visible. The superficial great
saphenous vein was also interrogated. Spectral Doppler was utilized
to evaluate flow at rest and with distal augmentation maneuvers in
the common femoral, femoral and popliteal veins.

[Series 1: us extrem low venous*right* · 13 of 45 slices shown]
[im 1/45]
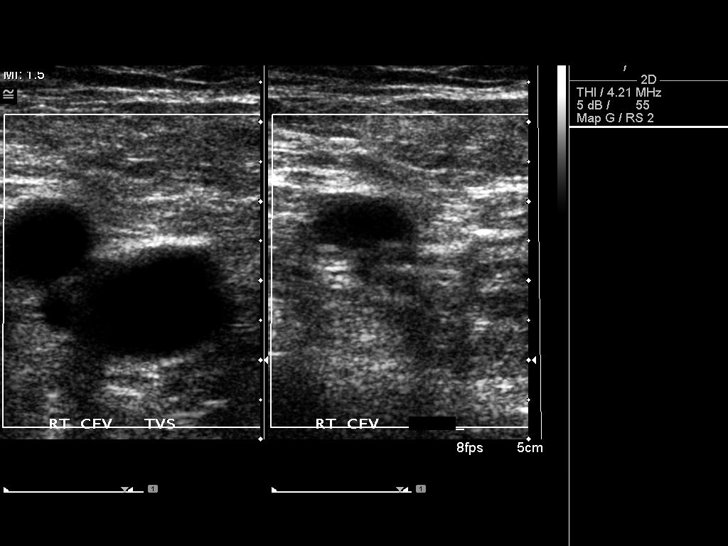
[im 4/45]
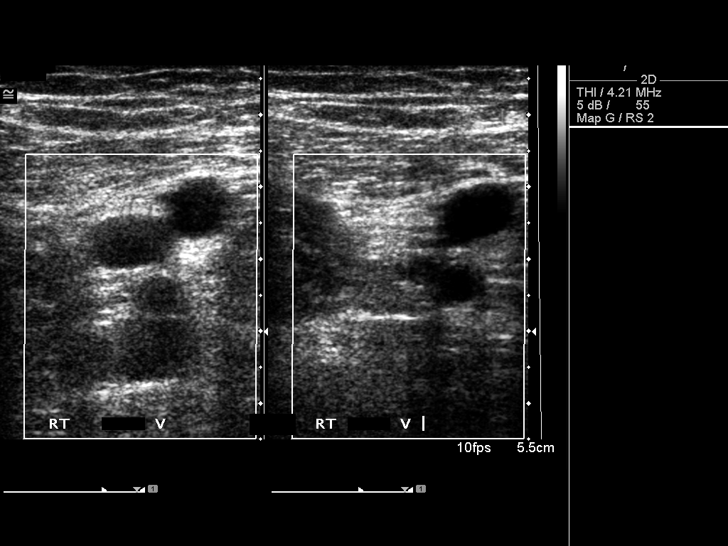
[im 8/45]
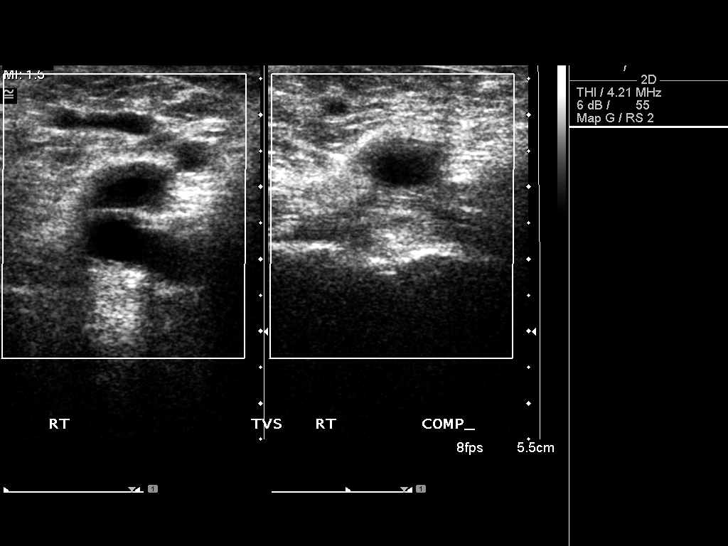
[im 12/45]
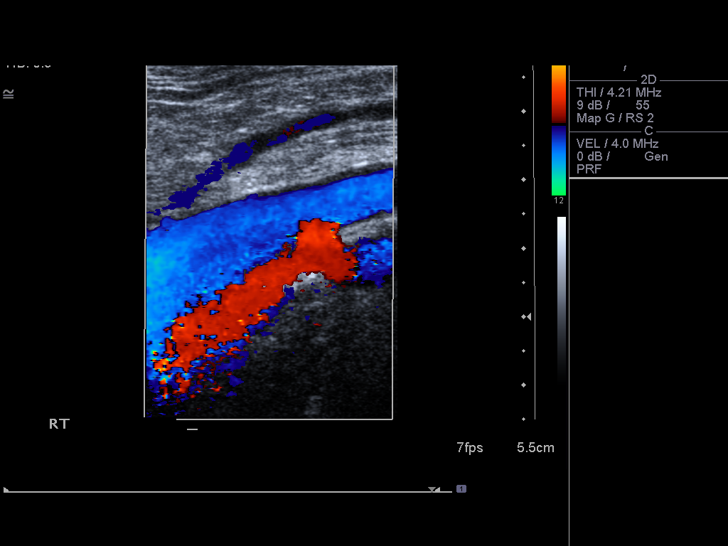
[im 16/45]
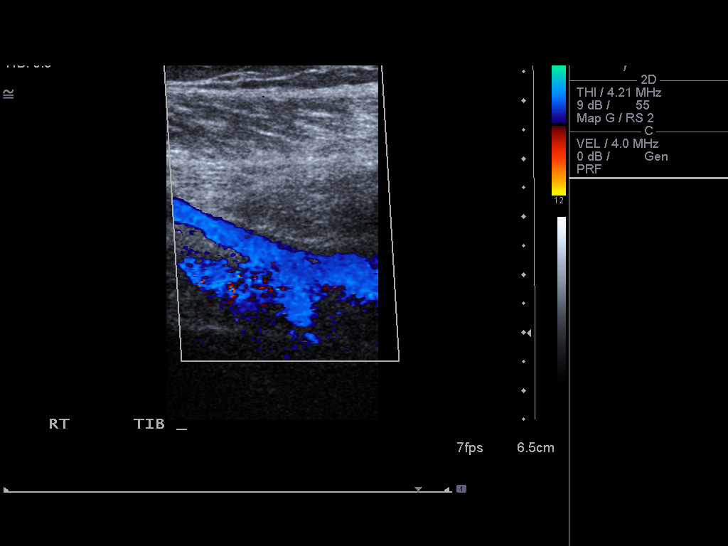
[im 20/45]
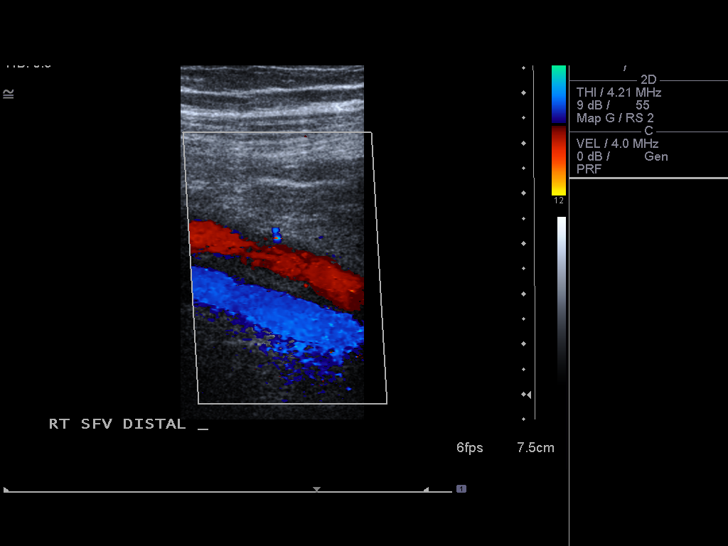
[im 23/45]
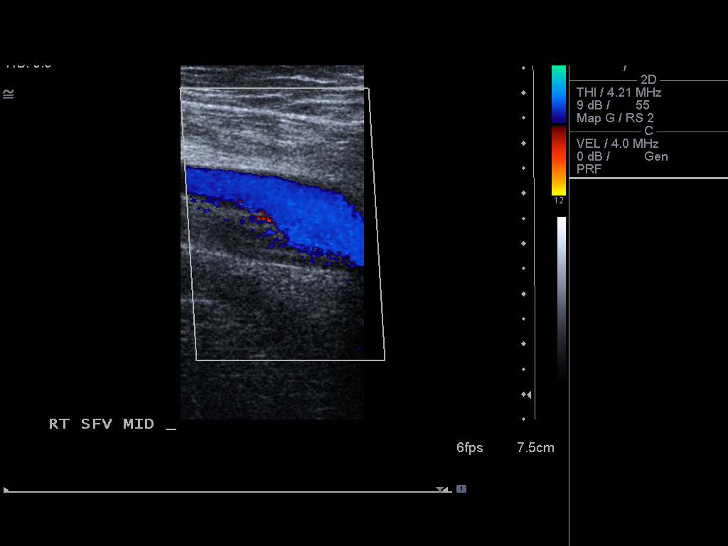
[im 25/45]
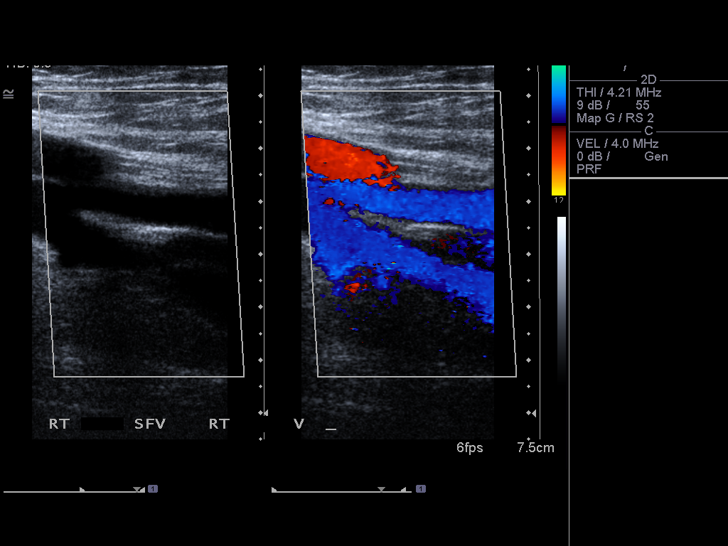
[im 29/45]
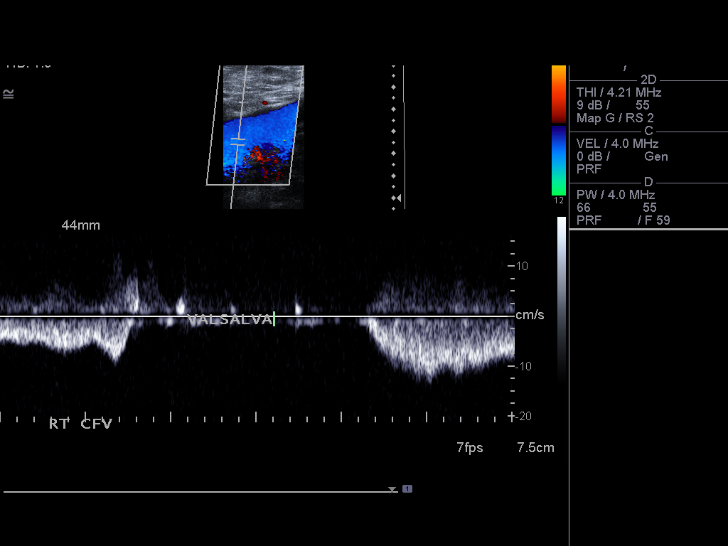
[im 33/45]
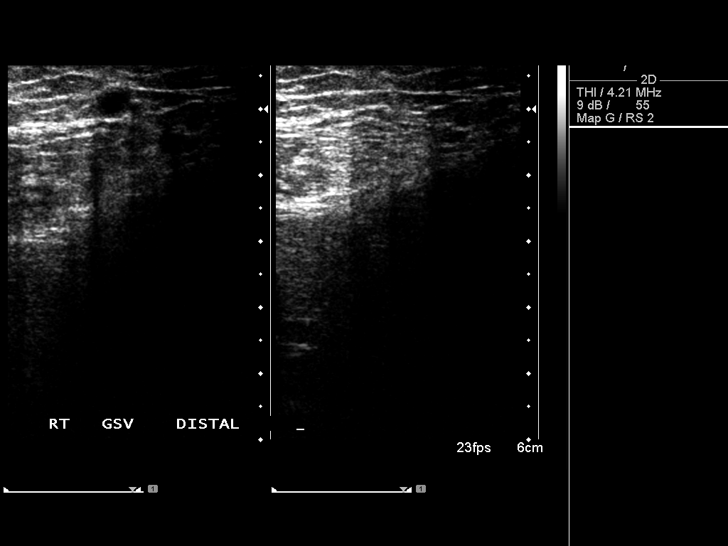
[im 37/45]
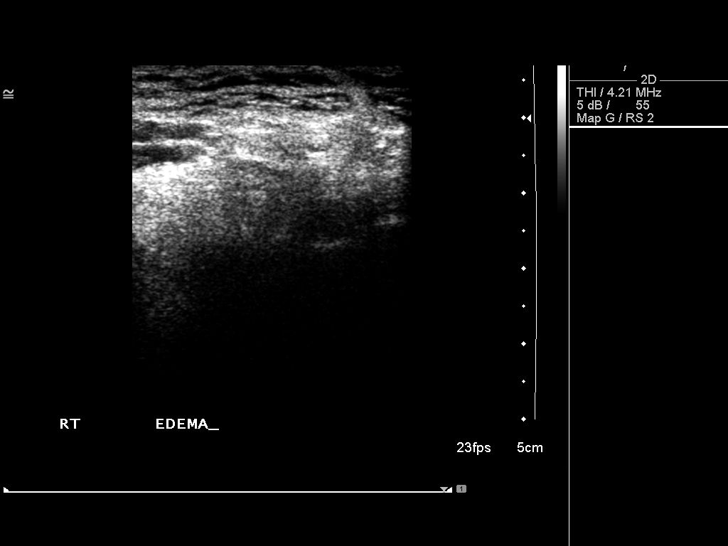
[im 41/45]
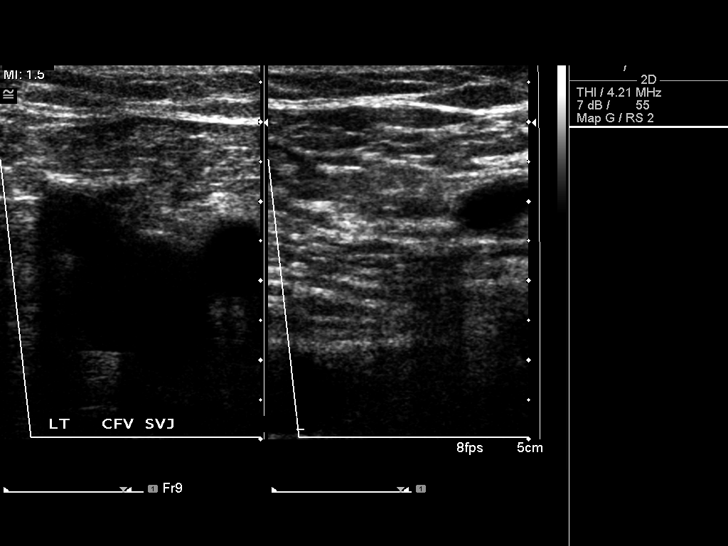
[im 45/45]
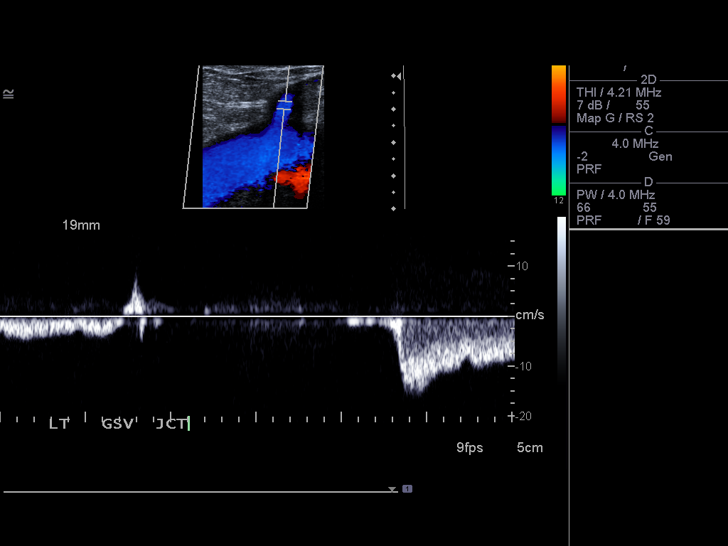

[13 of 24 positions shown; findings below may reference images not displayed]

FINDINGS: Contralateral Common Femoral Vein: Respiratory phasicity is normal
and symmetric with the symptomatic side. No evidence of thrombus.
Normal compressibility.

Common Femoral Vein: No evidence of thrombus. Normal
compressibility, respiratory phasicity and response to augmentation.

Saphenofemoral Junction: No evidence of thrombus. Normal
compressibility and flow on color Doppler imaging.

Profunda Femoral Vein: No evidence of thrombus. Normal
compressibility and flow on color Doppler imaging.

Femoral Vein: No evidence of thrombus. Normal compressibility,
respiratory phasicity and response to augmentation.

Popliteal Vein: No evidence of thrombus. Normal compressibility,
respiratory phasicity and response to augmentation.

Calf Veins: No evidence of thrombus. Normal compressibility and flow
on color Doppler imaging.

Superficial Great Saphenous Vein: No evidence of thrombus. Normal
compressibility and flow on color Doppler imaging.

Venous Reflux:  None.

Other Findings:  None.
IMPRESSION: No evidence of deep venous thrombosis.

## 2019-06-12 ENCOUNTER — Ambulatory Visit: Payer: Medicare Other

## 2019-06-27 ENCOUNTER — Ambulatory Visit: Payer: Medicare Other

## 2019-12-27 ENCOUNTER — Ambulatory Visit: Payer: Medicare PPO | Attending: Internal Medicine

## 2019-12-27 DIAGNOSIS — Z23 Encounter for immunization: Secondary | ICD-10-CM

## 2019-12-27 NOTE — Progress Notes (Signed)
   Covid-19 Vaccination Clinic  Name:  Corey Nielsen    MRN: [id number] DOB: April 06, 1946  12/27/2019  Corey Nielsen was observed post Covid-19 immunization for 15 minutes without incident. He was provided with Vaccine Information Sheet and instruction to access the V-Safe system.   Corey Nielsen was instructed to call 911 with any severe reactions post vaccine: Marland Kitchen Difficulty breathing  . Swelling of face and throat  . A fast heartbeat  . A bad rash all over body  . Dizziness and weakness

## 2020-01-10 ENCOUNTER — Ambulatory Visit
Admission: RE | Admit: 2020-01-10 | Discharge: 2020-01-10 | Disposition: A | Discharge status: Home / Self Care | Payer: Medicare PPO | Source: Ambulatory Visit | Attending: Internal Medicine | Admitting: Internal Medicine

## 2020-01-10 ENCOUNTER — Other Ambulatory Visit: Payer: Self-pay | Admitting: Internal Medicine

## 2020-01-10 DIAGNOSIS — M25572 Pain in left ankle and joints of left foot: Secondary | ICD-10-CM

## 2020-03-07 DIAGNOSIS — E1142 Type 2 diabetes mellitus with diabetic polyneuropathy: Secondary | ICD-10-CM | POA: Diagnosis not present

## 2020-03-07 DIAGNOSIS — E782 Mixed hyperlipidemia: Secondary | ICD-10-CM | POA: Diagnosis not present

## 2020-03-07 DIAGNOSIS — Z978 Presence of other specified devices: Secondary | ICD-10-CM | POA: Diagnosis not present

## 2020-03-07 DIAGNOSIS — E1165 Type 2 diabetes mellitus with hyperglycemia: Secondary | ICD-10-CM | POA: Diagnosis not present

## 2020-03-07 DIAGNOSIS — E113293 Type 2 diabetes mellitus with mild nonproliferative diabetic retinopathy without macular edema, bilateral: Secondary | ICD-10-CM | POA: Diagnosis not present

## 2020-03-07 DIAGNOSIS — Z9641 Presence of insulin pump (external) (internal): Secondary | ICD-10-CM | POA: Diagnosis not present

## 2020-03-07 DIAGNOSIS — Z794 Long term (current) use of insulin: Secondary | ICD-10-CM | POA: Diagnosis not present

## 2020-03-08 DIAGNOSIS — E1165 Type 2 diabetes mellitus with hyperglycemia: Secondary | ICD-10-CM | POA: Diagnosis not present

## 2020-03-23 DIAGNOSIS — B001 Herpesviral vesicular dermatitis: Secondary | ICD-10-CM | POA: Diagnosis not present

## 2020-03-23 DIAGNOSIS — A499 Bacterial infection, unspecified: Secondary | ICD-10-CM | POA: Diagnosis not present

## 2020-03-27 DIAGNOSIS — C44319 Basal cell carcinoma of skin of other parts of face: Secondary | ICD-10-CM | POA: Diagnosis not present

## 2020-03-27 DIAGNOSIS — D0462 Carcinoma in situ of skin of left upper limb, including shoulder: Secondary | ICD-10-CM | POA: Diagnosis not present

## 2020-03-27 DIAGNOSIS — C44719 Basal cell carcinoma of skin of left lower limb, including hip: Secondary | ICD-10-CM | POA: Diagnosis not present

## 2020-06-04 DIAGNOSIS — Z01812 Encounter for preprocedural laboratory examination: Secondary | ICD-10-CM | POA: Diagnosis not present

## 2020-06-06 DIAGNOSIS — E782 Mixed hyperlipidemia: Secondary | ICD-10-CM | POA: Diagnosis not present

## 2020-06-06 DIAGNOSIS — E1165 Type 2 diabetes mellitus with hyperglycemia: Secondary | ICD-10-CM | POA: Diagnosis not present

## 2020-06-06 DIAGNOSIS — Z794 Long term (current) use of insulin: Secondary | ICD-10-CM | POA: Diagnosis not present

## 2020-06-07 DIAGNOSIS — D123 Benign neoplasm of transverse colon: Secondary | ICD-10-CM | POA: Diagnosis not present

## 2020-06-07 DIAGNOSIS — E1165 Type 2 diabetes mellitus with hyperglycemia: Secondary | ICD-10-CM | POA: Diagnosis not present

## 2020-06-07 DIAGNOSIS — Z86010 Personal history of colonic polyps: Secondary | ICD-10-CM | POA: Diagnosis not present

## 2020-06-07 DIAGNOSIS — D120 Benign neoplasm of cecum: Secondary | ICD-10-CM | POA: Diagnosis not present

## 2020-06-11 DIAGNOSIS — D120 Benign neoplasm of cecum: Secondary | ICD-10-CM | POA: Diagnosis not present

## 2020-06-11 DIAGNOSIS — D123 Benign neoplasm of transverse colon: Secondary | ICD-10-CM | POA: Diagnosis not present

## 2020-06-13 DIAGNOSIS — Z794 Long term (current) use of insulin: Secondary | ICD-10-CM | POA: Diagnosis not present

## 2020-06-13 DIAGNOSIS — E113293 Type 2 diabetes mellitus with mild nonproliferative diabetic retinopathy without macular edema, bilateral: Secondary | ICD-10-CM | POA: Diagnosis not present

## 2020-06-13 DIAGNOSIS — Z9641 Presence of insulin pump (external) (internal): Secondary | ICD-10-CM | POA: Diagnosis not present

## 2020-06-13 DIAGNOSIS — E1142 Type 2 diabetes mellitus with diabetic polyneuropathy: Secondary | ICD-10-CM | POA: Diagnosis not present

## 2020-06-13 DIAGNOSIS — E782 Mixed hyperlipidemia: Secondary | ICD-10-CM | POA: Diagnosis not present

## 2020-06-13 DIAGNOSIS — E1165 Type 2 diabetes mellitus with hyperglycemia: Secondary | ICD-10-CM | POA: Diagnosis not present

## 2020-06-13 DIAGNOSIS — Z978 Presence of other specified devices: Secondary | ICD-10-CM | POA: Diagnosis not present

## 2020-08-16 DIAGNOSIS — D2372 Other benign neoplasm of skin of left lower limb, including hip: Secondary | ICD-10-CM | POA: Diagnosis not present

## 2020-08-16 DIAGNOSIS — L219 Seborrheic dermatitis, unspecified: Secondary | ICD-10-CM | POA: Diagnosis not present

## 2020-08-16 DIAGNOSIS — L821 Other seborrheic keratosis: Secondary | ICD-10-CM | POA: Diagnosis not present

## 2020-08-16 DIAGNOSIS — D225 Melanocytic nevi of trunk: Secondary | ICD-10-CM | POA: Diagnosis not present

## 2020-08-16 DIAGNOSIS — Z85828 Personal history of other malignant neoplasm of skin: Secondary | ICD-10-CM | POA: Diagnosis not present

## 2020-08-16 DIAGNOSIS — L570 Actinic keratosis: Secondary | ICD-10-CM | POA: Diagnosis not present

## 2020-08-16 DIAGNOSIS — Z86006 Personal history of melanoma in-situ: Secondary | ICD-10-CM | POA: Diagnosis not present

## 2020-08-16 DIAGNOSIS — L578 Other skin changes due to chronic exposure to nonionizing radiation: Secondary | ICD-10-CM | POA: Diagnosis not present

## 2020-08-16 DIAGNOSIS — D485 Neoplasm of uncertain behavior of skin: Secondary | ICD-10-CM | POA: Diagnosis not present

## 2020-08-16 DIAGNOSIS — C44729 Squamous cell carcinoma of skin of left lower limb, including hip: Secondary | ICD-10-CM | POA: Diagnosis not present

## 2020-08-23 DIAGNOSIS — E782 Mixed hyperlipidemia: Secondary | ICD-10-CM | POA: Diagnosis not present

## 2020-08-23 DIAGNOSIS — E1165 Type 2 diabetes mellitus with hyperglycemia: Secondary | ICD-10-CM | POA: Diagnosis not present

## 2020-08-23 DIAGNOSIS — Z794 Long term (current) use of insulin: Secondary | ICD-10-CM | POA: Diagnosis not present

## 2020-08-27 DIAGNOSIS — E1165 Type 2 diabetes mellitus with hyperglycemia: Secondary | ICD-10-CM | POA: Diagnosis not present

## 2020-08-27 DIAGNOSIS — N400 Enlarged prostate without lower urinary tract symptoms: Secondary | ICD-10-CM | POA: Diagnosis not present

## 2020-08-28 DIAGNOSIS — C44729 Squamous cell carcinoma of skin of left lower limb, including hip: Secondary | ICD-10-CM | POA: Diagnosis not present

## 2020-08-31 DIAGNOSIS — E1142 Type 2 diabetes mellitus with diabetic polyneuropathy: Secondary | ICD-10-CM | POA: Diagnosis not present

## 2020-08-31 DIAGNOSIS — E1165 Type 2 diabetes mellitus with hyperglycemia: Secondary | ICD-10-CM | POA: Diagnosis not present

## 2020-08-31 DIAGNOSIS — E782 Mixed hyperlipidemia: Secondary | ICD-10-CM | POA: Diagnosis not present

## 2020-08-31 DIAGNOSIS — Z7184 Encounter for health counseling related to travel: Secondary | ICD-10-CM | POA: Diagnosis not present

## 2020-08-31 DIAGNOSIS — E113293 Type 2 diabetes mellitus with mild nonproliferative diabetic retinopathy without macular edema, bilateral: Secondary | ICD-10-CM | POA: Diagnosis not present

## 2020-08-31 DIAGNOSIS — Z794 Long term (current) use of insulin: Secondary | ICD-10-CM | POA: Diagnosis not present

## 2020-08-31 DIAGNOSIS — Z978 Presence of other specified devices: Secondary | ICD-10-CM | POA: Diagnosis not present

## 2020-08-31 DIAGNOSIS — Z9641 Presence of insulin pump (external) (internal): Secondary | ICD-10-CM | POA: Diagnosis not present

## 2020-09-03 ENCOUNTER — Other Ambulatory Visit (HOSPITAL_BASED_OUTPATIENT_CLINIC_OR_DEPARTMENT_OTHER): Payer: Self-pay

## 2020-09-03 ENCOUNTER — Ambulatory Visit: Payer: Medicare PPO | Attending: Internal Medicine

## 2020-09-03 DIAGNOSIS — Z23 Encounter for immunization: Secondary | ICD-10-CM

## 2020-09-03 MED ORDER — MODERNA COVID-19 VACCINE 100 MCG/0.5ML IM SUSP
INTRAMUSCULAR | 0 refills | Status: DC
  Filled 2020-09-03: qty 0.25, 1d supply, fill #0

## 2020-09-03 NOTE — Progress Notes (Signed)
   Covid-19 Vaccination Clinic  Name:  JEOVANNY CUADROS    MRN: [id number] DOB: 07/13/1945  09/03/2020  Mr. Baze was observed post Covid-19 immunization for 15 minutes without incident. He was provided with Vaccine Information Sheet and instruction to access the V-Safe system.   Mr. Pullara was instructed to call 911 with any severe reactions post vaccine: Marland Kitchen Difficulty breathing  . Swelling of face and throat  . A fast heartbeat  . A bad rash all over body  . Dizziness and weakness   Immunizations Administered    Name Date Dose VIS Date Route   Moderna Covid-19 Booster Vaccine 09/03/2020  9:01 AM 0.25 mL 02/22/2020 Intramuscular   Manufacturer: Moderna   Lot: [id number]   [place]: 54008-676-19

## 2020-09-06 DIAGNOSIS — R972 Elevated prostate specific antigen [PSA]: Secondary | ICD-10-CM | POA: Diagnosis not present

## 2020-11-15 DIAGNOSIS — E1165 Type 2 diabetes mellitus with hyperglycemia: Secondary | ICD-10-CM | POA: Diagnosis not present

## 2020-11-16 DIAGNOSIS — D045 Carcinoma in situ of skin of trunk: Secondary | ICD-10-CM | POA: Diagnosis not present

## 2020-11-16 DIAGNOSIS — D485 Neoplasm of uncertain behavior of skin: Secondary | ICD-10-CM | POA: Diagnosis not present

## 2020-11-22 DIAGNOSIS — E1142 Type 2 diabetes mellitus with diabetic polyneuropathy: Secondary | ICD-10-CM | POA: Diagnosis not present

## 2020-11-22 DIAGNOSIS — E782 Mixed hyperlipidemia: Secondary | ICD-10-CM | POA: Diagnosis not present

## 2020-11-22 DIAGNOSIS — Z794 Long term (current) use of insulin: Secondary | ICD-10-CM | POA: Diagnosis not present

## 2020-11-22 DIAGNOSIS — E1165 Type 2 diabetes mellitus with hyperglycemia: Secondary | ICD-10-CM | POA: Diagnosis not present

## 2020-11-22 DIAGNOSIS — D045 Carcinoma in situ of skin of trunk: Secondary | ICD-10-CM | POA: Diagnosis not present

## 2020-11-27 DIAGNOSIS — E1165 Type 2 diabetes mellitus with hyperglycemia: Secondary | ICD-10-CM | POA: Diagnosis not present

## 2020-11-27 DIAGNOSIS — Z978 Presence of other specified devices: Secondary | ICD-10-CM | POA: Diagnosis not present

## 2020-11-27 DIAGNOSIS — E1142 Type 2 diabetes mellitus with diabetic polyneuropathy: Secondary | ICD-10-CM | POA: Diagnosis not present

## 2020-11-27 DIAGNOSIS — E785 Hyperlipidemia, unspecified: Secondary | ICD-10-CM | POA: Diagnosis not present

## 2020-11-27 DIAGNOSIS — Z794 Long term (current) use of insulin: Secondary | ICD-10-CM | POA: Diagnosis not present

## 2020-11-27 DIAGNOSIS — E113293 Type 2 diabetes mellitus with mild nonproliferative diabetic retinopathy without macular edema, bilateral: Secondary | ICD-10-CM | POA: Diagnosis not present

## 2020-11-27 DIAGNOSIS — Z9641 Presence of insulin pump (external) (internal): Secondary | ICD-10-CM | POA: Diagnosis not present

## 2020-11-27 DIAGNOSIS — E1169 Type 2 diabetes mellitus with other specified complication: Secondary | ICD-10-CM | POA: Diagnosis not present

## 2021-01-16 DIAGNOSIS — Z23 Encounter for immunization: Secondary | ICD-10-CM | POA: Diagnosis not present

## 2021-01-16 DIAGNOSIS — E1140 Type 2 diabetes mellitus with diabetic neuropathy, unspecified: Secondary | ICD-10-CM | POA: Diagnosis not present

## 2021-01-16 DIAGNOSIS — N401 Enlarged prostate with lower urinary tract symptoms: Secondary | ICD-10-CM | POA: Diagnosis not present

## 2021-01-16 DIAGNOSIS — Z1389 Encounter for screening for other disorder: Secondary | ICD-10-CM | POA: Diagnosis not present

## 2021-01-16 DIAGNOSIS — E7800 Pure hypercholesterolemia, unspecified: Secondary | ICD-10-CM | POA: Diagnosis not present

## 2021-01-16 DIAGNOSIS — E1165 Type 2 diabetes mellitus with hyperglycemia: Secondary | ICD-10-CM | POA: Diagnosis not present

## 2021-01-16 DIAGNOSIS — Z0000 Encounter for general adult medical examination without abnormal findings: Secondary | ICD-10-CM | POA: Diagnosis not present

## 2021-02-15 DIAGNOSIS — E785 Hyperlipidemia, unspecified: Secondary | ICD-10-CM | POA: Diagnosis not present

## 2021-02-15 DIAGNOSIS — E1165 Type 2 diabetes mellitus with hyperglycemia: Secondary | ICD-10-CM | POA: Diagnosis not present

## 2021-02-15 DIAGNOSIS — Z794 Long term (current) use of insulin: Secondary | ICD-10-CM | POA: Diagnosis not present

## 2021-02-15 DIAGNOSIS — E1169 Type 2 diabetes mellitus with other specified complication: Secondary | ICD-10-CM | POA: Diagnosis not present

## 2021-02-21 DIAGNOSIS — L821 Other seborrheic keratosis: Secondary | ICD-10-CM | POA: Diagnosis not present

## 2021-02-21 DIAGNOSIS — E1142 Type 2 diabetes mellitus with diabetic polyneuropathy: Secondary | ICD-10-CM | POA: Diagnosis not present

## 2021-02-21 DIAGNOSIS — D2372 Other benign neoplasm of skin of left lower limb, including hip: Secondary | ICD-10-CM | POA: Diagnosis not present

## 2021-02-21 DIAGNOSIS — Z85828 Personal history of other malignant neoplasm of skin: Secondary | ICD-10-CM | POA: Diagnosis not present

## 2021-02-21 DIAGNOSIS — D485 Neoplasm of uncertain behavior of skin: Secondary | ICD-10-CM | POA: Diagnosis not present

## 2021-02-21 DIAGNOSIS — M2141 Flat foot [pes planus] (acquired), right foot: Secondary | ICD-10-CM | POA: Diagnosis not present

## 2021-02-21 DIAGNOSIS — L918 Other hypertrophic disorders of the skin: Secondary | ICD-10-CM | POA: Diagnosis not present

## 2021-02-21 DIAGNOSIS — D225 Melanocytic nevi of trunk: Secondary | ICD-10-CM | POA: Diagnosis not present

## 2021-02-21 DIAGNOSIS — Z86006 Personal history of melanoma in-situ: Secondary | ICD-10-CM | POA: Diagnosis not present

## 2021-02-21 DIAGNOSIS — D0462 Carcinoma in situ of skin of left upper limb, including shoulder: Secondary | ICD-10-CM | POA: Diagnosis not present

## 2021-02-21 DIAGNOSIS — L578 Other skin changes due to chronic exposure to nonionizing radiation: Secondary | ICD-10-CM | POA: Diagnosis not present

## 2021-02-21 DIAGNOSIS — Z23 Encounter for immunization: Secondary | ICD-10-CM | POA: Diagnosis not present

## 2021-02-22 DIAGNOSIS — E1165 Type 2 diabetes mellitus with hyperglycemia: Secondary | ICD-10-CM | POA: Diagnosis not present

## 2021-02-22 DIAGNOSIS — N182 Chronic kidney disease, stage 2 (mild): Secondary | ICD-10-CM | POA: Diagnosis not present

## 2021-02-22 DIAGNOSIS — Z794 Long term (current) use of insulin: Secondary | ICD-10-CM | POA: Diagnosis not present

## 2021-02-22 DIAGNOSIS — Z7984 Long term (current) use of oral hypoglycemic drugs: Secondary | ICD-10-CM | POA: Diagnosis not present

## 2021-02-22 DIAGNOSIS — E1169 Type 2 diabetes mellitus with other specified complication: Secondary | ICD-10-CM | POA: Diagnosis not present

## 2021-02-22 DIAGNOSIS — E1122 Type 2 diabetes mellitus with diabetic chronic kidney disease: Secondary | ICD-10-CM | POA: Diagnosis not present

## 2021-02-22 DIAGNOSIS — E113293 Type 2 diabetes mellitus with mild nonproliferative diabetic retinopathy without macular edema, bilateral: Secondary | ICD-10-CM | POA: Diagnosis not present

## 2021-02-22 DIAGNOSIS — I129 Hypertensive chronic kidney disease with stage 1 through stage 4 chronic kidney disease, or unspecified chronic kidney disease: Secondary | ICD-10-CM | POA: Diagnosis not present

## 2021-02-22 DIAGNOSIS — E1142 Type 2 diabetes mellitus with diabetic polyneuropathy: Secondary | ICD-10-CM | POA: Diagnosis not present

## 2021-03-01 DIAGNOSIS — H35372 Puckering of macula, left eye: Secondary | ICD-10-CM | POA: Diagnosis not present

## 2021-03-01 DIAGNOSIS — Z794 Long term (current) use of insulin: Secondary | ICD-10-CM | POA: Diagnosis not present

## 2021-03-01 DIAGNOSIS — E1136 Type 2 diabetes mellitus with diabetic cataract: Secondary | ICD-10-CM | POA: Diagnosis not present

## 2021-03-01 DIAGNOSIS — H2513 Age-related nuclear cataract, bilateral: Secondary | ICD-10-CM | POA: Diagnosis not present

## 2021-03-07 DIAGNOSIS — E1165 Type 2 diabetes mellitus with hyperglycemia: Secondary | ICD-10-CM | POA: Diagnosis not present

## 2021-04-02 DIAGNOSIS — M2141 Flat foot [pes planus] (acquired), right foot: Secondary | ICD-10-CM | POA: Diagnosis not present

## 2021-04-02 DIAGNOSIS — E1142 Type 2 diabetes mellitus with diabetic polyneuropathy: Secondary | ICD-10-CM | POA: Diagnosis not present

## 2021-04-18 DIAGNOSIS — D045 Carcinoma in situ of skin of trunk: Secondary | ICD-10-CM | POA: Diagnosis not present

## 2021-05-23 DIAGNOSIS — C44529 Squamous cell carcinoma of skin of other part of trunk: Secondary | ICD-10-CM | POA: Diagnosis not present

## 2021-05-23 DIAGNOSIS — L905 Scar conditions and fibrosis of skin: Secondary | ICD-10-CM | POA: Diagnosis not present

## 2021-05-23 DIAGNOSIS — Z23 Encounter for immunization: Secondary | ICD-10-CM | POA: Diagnosis not present

## 2021-05-23 DIAGNOSIS — D485 Neoplasm of uncertain behavior of skin: Secondary | ICD-10-CM | POA: Diagnosis not present

## 2021-05-23 DIAGNOSIS — L570 Actinic keratosis: Secondary | ICD-10-CM | POA: Diagnosis not present

## 2021-05-24 DIAGNOSIS — I129 Hypertensive chronic kidney disease with stage 1 through stage 4 chronic kidney disease, or unspecified chronic kidney disease: Secondary | ICD-10-CM | POA: Diagnosis not present

## 2021-05-24 DIAGNOSIS — N182 Chronic kidney disease, stage 2 (mild): Secondary | ICD-10-CM | POA: Diagnosis not present

## 2021-05-24 DIAGNOSIS — Z794 Long term (current) use of insulin: Secondary | ICD-10-CM | POA: Diagnosis not present

## 2021-05-24 DIAGNOSIS — E1122 Type 2 diabetes mellitus with diabetic chronic kidney disease: Secondary | ICD-10-CM | POA: Diagnosis not present

## 2021-05-28 DIAGNOSIS — E1165 Type 2 diabetes mellitus with hyperglycemia: Secondary | ICD-10-CM | POA: Diagnosis not present

## 2021-05-28 DIAGNOSIS — I129 Hypertensive chronic kidney disease with stage 1 through stage 4 chronic kidney disease, or unspecified chronic kidney disease: Secondary | ICD-10-CM | POA: Diagnosis not present

## 2021-05-28 DIAGNOSIS — Z9641 Presence of insulin pump (external) (internal): Secondary | ICD-10-CM | POA: Diagnosis not present

## 2021-05-28 DIAGNOSIS — E1142 Type 2 diabetes mellitus with diabetic polyneuropathy: Secondary | ICD-10-CM | POA: Diagnosis not present

## 2021-05-28 DIAGNOSIS — E1122 Type 2 diabetes mellitus with diabetic chronic kidney disease: Secondary | ICD-10-CM | POA: Diagnosis not present

## 2021-05-28 DIAGNOSIS — Z794 Long term (current) use of insulin: Secondary | ICD-10-CM | POA: Diagnosis not present

## 2021-05-28 DIAGNOSIS — E113293 Type 2 diabetes mellitus with mild nonproliferative diabetic retinopathy without macular edema, bilateral: Secondary | ICD-10-CM | POA: Diagnosis not present

## 2021-05-28 DIAGNOSIS — N182 Chronic kidney disease, stage 2 (mild): Secondary | ICD-10-CM | POA: Diagnosis not present

## 2021-05-28 DIAGNOSIS — Z7984 Long term (current) use of oral hypoglycemic drugs: Secondary | ICD-10-CM | POA: Diagnosis not present

## 2021-05-30 DIAGNOSIS — E1165 Type 2 diabetes mellitus with hyperglycemia: Secondary | ICD-10-CM | POA: Diagnosis not present

## 2021-06-27 DIAGNOSIS — L570 Actinic keratosis: Secondary | ICD-10-CM | POA: Diagnosis not present

## 2021-06-27 DIAGNOSIS — C44529 Squamous cell carcinoma of skin of other part of trunk: Secondary | ICD-10-CM | POA: Diagnosis not present

## 2021-06-27 DIAGNOSIS — L905 Scar conditions and fibrosis of skin: Secondary | ICD-10-CM | POA: Diagnosis not present

## 2021-07-01 DIAGNOSIS — L84 Corns and callosities: Secondary | ICD-10-CM | POA: Diagnosis not present

## 2021-07-01 DIAGNOSIS — E1140 Type 2 diabetes mellitus with diabetic neuropathy, unspecified: Secondary | ICD-10-CM | POA: Diagnosis not present

## 2021-07-16 DIAGNOSIS — Z794 Long term (current) use of insulin: Secondary | ICD-10-CM | POA: Diagnosis not present

## 2021-07-16 DIAGNOSIS — B351 Tinea unguium: Secondary | ICD-10-CM | POA: Diagnosis not present

## 2021-07-16 DIAGNOSIS — M79674 Pain in right toe(s): Secondary | ICD-10-CM | POA: Diagnosis not present

## 2021-07-16 DIAGNOSIS — M79675 Pain in left toe(s): Secondary | ICD-10-CM | POA: Diagnosis not present

## 2021-07-16 DIAGNOSIS — E119 Type 2 diabetes mellitus without complications: Secondary | ICD-10-CM | POA: Diagnosis not present

## 2021-08-12 DIAGNOSIS — E1165 Type 2 diabetes mellitus with hyperglycemia: Secondary | ICD-10-CM | POA: Diagnosis not present

## 2021-08-28 DIAGNOSIS — Z794 Long term (current) use of insulin: Secondary | ICD-10-CM | POA: Diagnosis not present

## 2021-08-28 DIAGNOSIS — E785 Hyperlipidemia, unspecified: Secondary | ICD-10-CM | POA: Diagnosis not present

## 2021-08-28 DIAGNOSIS — E1122 Type 2 diabetes mellitus with diabetic chronic kidney disease: Secondary | ICD-10-CM | POA: Diagnosis not present

## 2021-08-28 DIAGNOSIS — E1142 Type 2 diabetes mellitus with diabetic polyneuropathy: Secondary | ICD-10-CM | POA: Diagnosis not present

## 2021-08-28 DIAGNOSIS — N182 Chronic kidney disease, stage 2 (mild): Secondary | ICD-10-CM | POA: Diagnosis not present

## 2021-08-28 DIAGNOSIS — E1169 Type 2 diabetes mellitus with other specified complication: Secondary | ICD-10-CM | POA: Diagnosis not present

## 2021-08-28 DIAGNOSIS — I129 Hypertensive chronic kidney disease with stage 1 through stage 4 chronic kidney disease, or unspecified chronic kidney disease: Secondary | ICD-10-CM | POA: Diagnosis not present

## 2021-08-29 DIAGNOSIS — Z86006 Personal history of melanoma in-situ: Secondary | ICD-10-CM | POA: Diagnosis not present

## 2021-08-29 DIAGNOSIS — Z4802 Encounter for removal of sutures: Secondary | ICD-10-CM | POA: Diagnosis not present

## 2021-08-29 DIAGNOSIS — L570 Actinic keratosis: Secondary | ICD-10-CM | POA: Diagnosis not present

## 2021-08-29 DIAGNOSIS — L821 Other seborrheic keratosis: Secondary | ICD-10-CM | POA: Diagnosis not present

## 2021-08-29 DIAGNOSIS — Z85828 Personal history of other malignant neoplasm of skin: Secondary | ICD-10-CM | POA: Diagnosis not present

## 2021-08-29 DIAGNOSIS — L578 Other skin changes due to chronic exposure to nonionizing radiation: Secondary | ICD-10-CM | POA: Diagnosis not present

## 2021-08-29 DIAGNOSIS — D2372 Other benign neoplasm of skin of left lower limb, including hip: Secondary | ICD-10-CM | POA: Diagnosis not present

## 2021-08-29 DIAGNOSIS — D225 Melanocytic nevi of trunk: Secondary | ICD-10-CM | POA: Diagnosis not present

## 2021-08-30 DIAGNOSIS — E11649 Type 2 diabetes mellitus with hypoglycemia without coma: Secondary | ICD-10-CM | POA: Diagnosis not present

## 2021-08-30 DIAGNOSIS — R972 Elevated prostate specific antigen [PSA]: Secondary | ICD-10-CM | POA: Diagnosis not present

## 2021-08-30 DIAGNOSIS — E1122 Type 2 diabetes mellitus with diabetic chronic kidney disease: Secondary | ICD-10-CM | POA: Diagnosis not present

## 2021-08-30 DIAGNOSIS — Z7984 Long term (current) use of oral hypoglycemic drugs: Secondary | ICD-10-CM | POA: Diagnosis not present

## 2021-08-30 DIAGNOSIS — E113293 Type 2 diabetes mellitus with mild nonproliferative diabetic retinopathy without macular edema, bilateral: Secondary | ICD-10-CM | POA: Diagnosis not present

## 2021-08-30 DIAGNOSIS — E1165 Type 2 diabetes mellitus with hyperglycemia: Secondary | ICD-10-CM | POA: Diagnosis not present

## 2021-08-30 DIAGNOSIS — Z9641 Presence of insulin pump (external) (internal): Secondary | ICD-10-CM | POA: Diagnosis not present

## 2021-08-30 DIAGNOSIS — Z794 Long term (current) use of insulin: Secondary | ICD-10-CM | POA: Diagnosis not present

## 2021-08-30 DIAGNOSIS — E1142 Type 2 diabetes mellitus with diabetic polyneuropathy: Secondary | ICD-10-CM | POA: Diagnosis not present

## 2021-08-30 DIAGNOSIS — I129 Hypertensive chronic kidney disease with stage 1 through stage 4 chronic kidney disease, or unspecified chronic kidney disease: Secondary | ICD-10-CM | POA: Diagnosis not present

## 2021-09-06 DIAGNOSIS — N401 Enlarged prostate with lower urinary tract symptoms: Secondary | ICD-10-CM | POA: Diagnosis not present

## 2021-09-06 DIAGNOSIS — R3915 Urgency of urination: Secondary | ICD-10-CM | POA: Diagnosis not present

## 2021-09-06 DIAGNOSIS — E1165 Type 2 diabetes mellitus with hyperglycemia: Secondary | ICD-10-CM | POA: Diagnosis not present

## 2021-09-06 DIAGNOSIS — R972 Elevated prostate specific antigen [PSA]: Secondary | ICD-10-CM | POA: Diagnosis not present

## 2021-09-27 DIAGNOSIS — R972 Elevated prostate specific antigen [PSA]: Secondary | ICD-10-CM | POA: Diagnosis not present

## 2021-10-31 DIAGNOSIS — E1165 Type 2 diabetes mellitus with hyperglycemia: Secondary | ICD-10-CM | POA: Diagnosis not present

## 2021-11-25 DIAGNOSIS — Z794 Long term (current) use of insulin: Secondary | ICD-10-CM | POA: Diagnosis not present

## 2021-11-25 DIAGNOSIS — E11649 Type 2 diabetes mellitus with hypoglycemia without coma: Secondary | ICD-10-CM | POA: Diagnosis not present

## 2021-11-25 DIAGNOSIS — E1165 Type 2 diabetes mellitus with hyperglycemia: Secondary | ICD-10-CM | POA: Diagnosis not present

## 2021-11-25 DIAGNOSIS — E785 Hyperlipidemia, unspecified: Secondary | ICD-10-CM | POA: Diagnosis not present

## 2021-11-25 DIAGNOSIS — E1169 Type 2 diabetes mellitus with other specified complication: Secondary | ICD-10-CM | POA: Diagnosis not present

## 2021-11-25 DIAGNOSIS — N182 Chronic kidney disease, stage 2 (mild): Secondary | ICD-10-CM | POA: Diagnosis not present

## 2021-11-25 DIAGNOSIS — E1122 Type 2 diabetes mellitus with diabetic chronic kidney disease: Secondary | ICD-10-CM | POA: Diagnosis not present

## 2021-11-25 DIAGNOSIS — I129 Hypertensive chronic kidney disease with stage 1 through stage 4 chronic kidney disease, or unspecified chronic kidney disease: Secondary | ICD-10-CM | POA: Diagnosis not present

## 2021-11-29 DIAGNOSIS — Z7985 Long-term (current) use of injectable non-insulin antidiabetic drugs: Secondary | ICD-10-CM | POA: Diagnosis not present

## 2021-11-29 DIAGNOSIS — E113293 Type 2 diabetes mellitus with mild nonproliferative diabetic retinopathy without macular edema, bilateral: Secondary | ICD-10-CM | POA: Diagnosis not present

## 2021-11-29 DIAGNOSIS — Z794 Long term (current) use of insulin: Secondary | ICD-10-CM | POA: Diagnosis not present

## 2021-11-29 DIAGNOSIS — E1122 Type 2 diabetes mellitus with diabetic chronic kidney disease: Secondary | ICD-10-CM | POA: Diagnosis not present

## 2021-11-29 DIAGNOSIS — I129 Hypertensive chronic kidney disease with stage 1 through stage 4 chronic kidney disease, or unspecified chronic kidney disease: Secondary | ICD-10-CM | POA: Diagnosis not present

## 2021-11-29 DIAGNOSIS — Z7984 Long term (current) use of oral hypoglycemic drugs: Secondary | ICD-10-CM | POA: Diagnosis not present

## 2021-11-29 DIAGNOSIS — E1142 Type 2 diabetes mellitus with diabetic polyneuropathy: Secondary | ICD-10-CM | POA: Diagnosis not present

## 2021-11-29 DIAGNOSIS — Z9641 Presence of insulin pump (external) (internal): Secondary | ICD-10-CM | POA: Diagnosis not present

## 2021-11-29 DIAGNOSIS — E1165 Type 2 diabetes mellitus with hyperglycemia: Secondary | ICD-10-CM | POA: Diagnosis not present

## 2022-01-16 DIAGNOSIS — Z7984 Long term (current) use of oral hypoglycemic drugs: Secondary | ICD-10-CM | POA: Diagnosis not present

## 2022-01-16 DIAGNOSIS — Z794 Long term (current) use of insulin: Secondary | ICD-10-CM | POA: Diagnosis not present

## 2022-01-16 DIAGNOSIS — E1169 Type 2 diabetes mellitus with other specified complication: Secondary | ICD-10-CM | POA: Diagnosis not present

## 2022-01-16 DIAGNOSIS — M79671 Pain in right foot: Secondary | ICD-10-CM | POA: Diagnosis not present

## 2022-01-16 DIAGNOSIS — M79672 Pain in left foot: Secondary | ICD-10-CM | POA: Diagnosis not present

## 2022-01-20 DIAGNOSIS — E1165 Type 2 diabetes mellitus with hyperglycemia: Secondary | ICD-10-CM | POA: Diagnosis not present

## 2022-01-22 DIAGNOSIS — Z1331 Encounter for screening for depression: Secondary | ICD-10-CM | POA: Diagnosis not present

## 2022-01-22 DIAGNOSIS — E7800 Pure hypercholesterolemia, unspecified: Secondary | ICD-10-CM | POA: Diagnosis not present

## 2022-01-22 DIAGNOSIS — Z86010 Personal history of colonic polyps: Secondary | ICD-10-CM | POA: Diagnosis not present

## 2022-01-22 DIAGNOSIS — Z0000 Encounter for general adult medical examination without abnormal findings: Secondary | ICD-10-CM | POA: Diagnosis not present

## 2022-01-22 DIAGNOSIS — R052 Subacute cough: Secondary | ICD-10-CM | POA: Diagnosis not present

## 2022-01-22 DIAGNOSIS — N401 Enlarged prostate with lower urinary tract symptoms: Secondary | ICD-10-CM | POA: Diagnosis not present

## 2022-01-22 DIAGNOSIS — E1140 Type 2 diabetes mellitus with diabetic neuropathy, unspecified: Secondary | ICD-10-CM | POA: Diagnosis not present

## 2022-02-13 DIAGNOSIS — E1165 Type 2 diabetes mellitus with hyperglycemia: Secondary | ICD-10-CM | POA: Diagnosis not present

## 2022-02-19 DIAGNOSIS — Z86006 Personal history of melanoma in-situ: Secondary | ICD-10-CM | POA: Diagnosis not present

## 2022-02-19 DIAGNOSIS — Z85828 Personal history of other malignant neoplasm of skin: Secondary | ICD-10-CM | POA: Diagnosis not present

## 2022-02-19 DIAGNOSIS — L570 Actinic keratosis: Secondary | ICD-10-CM | POA: Diagnosis not present

## 2022-02-19 DIAGNOSIS — L578 Other skin changes due to chronic exposure to nonionizing radiation: Secondary | ICD-10-CM | POA: Diagnosis not present

## 2022-02-19 DIAGNOSIS — L821 Other seborrheic keratosis: Secondary | ICD-10-CM | POA: Diagnosis not present

## 2022-02-19 DIAGNOSIS — D2372 Other benign neoplasm of skin of left lower limb, including hip: Secondary | ICD-10-CM | POA: Diagnosis not present

## 2022-02-19 DIAGNOSIS — D225 Melanocytic nevi of trunk: Secondary | ICD-10-CM | POA: Diagnosis not present

## 2022-03-05 DIAGNOSIS — I129 Hypertensive chronic kidney disease with stage 1 through stage 4 chronic kidney disease, or unspecified chronic kidney disease: Secondary | ICD-10-CM | POA: Diagnosis not present

## 2022-03-05 DIAGNOSIS — Z794 Long term (current) use of insulin: Secondary | ICD-10-CM | POA: Diagnosis not present

## 2022-03-05 DIAGNOSIS — E1122 Type 2 diabetes mellitus with diabetic chronic kidney disease: Secondary | ICD-10-CM | POA: Diagnosis not present

## 2022-03-05 DIAGNOSIS — N182 Chronic kidney disease, stage 2 (mild): Secondary | ICD-10-CM | POA: Diagnosis not present

## 2022-03-05 DIAGNOSIS — E785 Hyperlipidemia, unspecified: Secondary | ICD-10-CM | POA: Diagnosis not present

## 2022-03-05 DIAGNOSIS — E1165 Type 2 diabetes mellitus with hyperglycemia: Secondary | ICD-10-CM | POA: Diagnosis not present

## 2022-03-05 DIAGNOSIS — E1142 Type 2 diabetes mellitus with diabetic polyneuropathy: Secondary | ICD-10-CM | POA: Diagnosis not present

## 2022-03-05 DIAGNOSIS — E113293 Type 2 diabetes mellitus with mild nonproliferative diabetic retinopathy without macular edema, bilateral: Secondary | ICD-10-CM | POA: Diagnosis not present

## 2022-03-05 DIAGNOSIS — E1169 Type 2 diabetes mellitus with other specified complication: Secondary | ICD-10-CM | POA: Diagnosis not present

## 2022-03-11 DIAGNOSIS — Z7985 Long-term (current) use of injectable non-insulin antidiabetic drugs: Secondary | ICD-10-CM | POA: Diagnosis not present

## 2022-03-11 DIAGNOSIS — E1165 Type 2 diabetes mellitus with hyperglycemia: Secondary | ICD-10-CM | POA: Diagnosis not present

## 2022-03-11 DIAGNOSIS — Z794 Long term (current) use of insulin: Secondary | ICD-10-CM | POA: Diagnosis not present

## 2022-03-11 DIAGNOSIS — I129 Hypertensive chronic kidney disease with stage 1 through stage 4 chronic kidney disease, or unspecified chronic kidney disease: Secondary | ICD-10-CM | POA: Diagnosis not present

## 2022-03-11 DIAGNOSIS — E113293 Type 2 diabetes mellitus with mild nonproliferative diabetic retinopathy without macular edema, bilateral: Secondary | ICD-10-CM | POA: Diagnosis not present

## 2022-03-11 DIAGNOSIS — N182 Chronic kidney disease, stage 2 (mild): Secondary | ICD-10-CM | POA: Diagnosis not present

## 2022-03-11 DIAGNOSIS — Z7984 Long term (current) use of oral hypoglycemic drugs: Secondary | ICD-10-CM | POA: Diagnosis not present

## 2022-03-11 DIAGNOSIS — E1142 Type 2 diabetes mellitus with diabetic polyneuropathy: Secondary | ICD-10-CM | POA: Diagnosis not present

## 2022-03-11 DIAGNOSIS — E1122 Type 2 diabetes mellitus with diabetic chronic kidney disease: Secondary | ICD-10-CM | POA: Diagnosis not present

## 2022-03-12 DIAGNOSIS — Z794 Long term (current) use of insulin: Secondary | ICD-10-CM | POA: Diagnosis not present

## 2022-03-12 DIAGNOSIS — Z7984 Long term (current) use of oral hypoglycemic drugs: Secondary | ICD-10-CM | POA: Diagnosis not present

## 2022-03-12 DIAGNOSIS — L97522 Non-pressure chronic ulcer of other part of left foot with fat layer exposed: Secondary | ICD-10-CM | POA: Diagnosis not present

## 2022-03-12 DIAGNOSIS — E11621 Type 2 diabetes mellitus with foot ulcer: Secondary | ICD-10-CM | POA: Diagnosis not present

## 2022-03-14 DIAGNOSIS — R079 Chest pain, unspecified: Secondary | ICD-10-CM | POA: Diagnosis not present

## 2022-03-14 DIAGNOSIS — Z794 Long term (current) use of insulin: Secondary | ICD-10-CM | POA: Diagnosis not present

## 2022-03-14 DIAGNOSIS — Z7985 Long-term (current) use of injectable non-insulin antidiabetic drugs: Secondary | ICD-10-CM | POA: Diagnosis not present

## 2022-03-14 DIAGNOSIS — E11621 Type 2 diabetes mellitus with foot ulcer: Secondary | ICD-10-CM | POA: Diagnosis not present

## 2022-03-14 DIAGNOSIS — L97521 Non-pressure chronic ulcer of other part of left foot limited to breakdown of skin: Secondary | ICD-10-CM | POA: Diagnosis not present

## 2022-03-14 DIAGNOSIS — E1140 Type 2 diabetes mellitus with diabetic neuropathy, unspecified: Secondary | ICD-10-CM | POA: Diagnosis not present

## 2022-03-14 DIAGNOSIS — Z7984 Long term (current) use of oral hypoglycemic drugs: Secondary | ICD-10-CM | POA: Diagnosis not present

## 2022-03-18 ENCOUNTER — Other Ambulatory Visit: Payer: Self-pay | Admitting: Internal Medicine

## 2022-03-18 DIAGNOSIS — R1013 Epigastric pain: Secondary | ICD-10-CM

## 2022-03-19 ENCOUNTER — Ambulatory Visit: Payer: Medicare PPO | Attending: Cardiovascular Disease | Admitting: Cardiovascular Disease

## 2022-03-19 ENCOUNTER — Encounter: Payer: Self-pay | Admitting: Cardiovascular Disease

## 2022-03-19 VITALS — BP 144/66 | HR 72 | Ht 76.0 in | Wt 229.0 lb

## 2022-03-19 DIAGNOSIS — E785 Hyperlipidemia, unspecified: Secondary | ICD-10-CM | POA: Diagnosis not present

## 2022-03-19 DIAGNOSIS — R0789 Other chest pain: Secondary | ICD-10-CM

## 2022-03-19 NOTE — Patient Instructions (Signed)
Medication Instructions:  Your physician recommends that you continue on your current medications as directed. Please refer to the Current Medication list given to you today.  *If you need a refill on your cardiac medications before your next appointment, please call your pharmacy*   Testing/Procedures: Dr. Gwenlyn Found has ordered a CT coronary calcium score.   Test location:  [facility] [place], [place] [address])   This is $99 out of pocket.   Coronary CalciumScan A coronary calcium scan is an imaging test used to look for deposits of calcium and other fatty materials (plaques) in the inner lining of the blood vessels of the heart (coronary arteries). These deposits of calcium and plaques can partly clog and narrow the coronary arteries without producing any symptoms or warning signs. This puts a person at risk for a heart attack. This test can detect these deposits before symptoms develop. Tell a health care provider about: Any allergies you have. All medicines you are taking, including vitamins, herbs, eye drops, creams, and over-the-counter medicines. Any problems you or family members have had with anesthetic medicines. Any blood disorders you have. Any surgeries you have had. Any medical conditions you have. Whether you are pregnant or may be pregnant. What are the risks? Generally, this is a safe procedure. However, problems may occur, including: Harm to a pregnant woman and her unborn baby. This test involves the use of radiation. Radiation exposure can be dangerous to a pregnant woman and her unborn baby. If you are pregnant, you generally should not have this procedure done. Slight increase in the risk of cancer. This is because of the radiation involved in the test. What happens before the procedure? No preparation is needed for this procedure. What happens during the procedure? You will undress and remove any jewelry around your neck or  chest. You will put on a hospital gown. Sticky electrodes will be placed on your chest. The electrodes will be connected to an electrocardiogram (ECG) machine to record a tracing of the electrical activity of your heart. A CT scanner will take pictures of your heart. During this time, you will be asked to lie still and hold your breath for 2-3 seconds while a picture of your heart is being taken. The procedure may vary among health care providers and hospitals. What happens after the procedure? You can get dressed. You can return to your normal activities. It is up to you to get the results of your test. Ask your health care provider, or the department that is doing the test, when your results will be ready. Summary A coronary calcium scan is an imaging test used to look for deposits of calcium and other fatty materials (plaques) in the inner lining of the blood vessels of the heart (coronary arteries). Generally, this is a safe procedure. Tell your health care provider if you are pregnant or may be pregnant. No preparation is needed for this procedure. A CT scanner will take pictures of your heart. You can return to your normal activities after the scan is done. This information is not intended to replace advice given to you by your health care provider. Make sure you discuss any questions you have with your health care provider. Document Released: 10/18/2007 Document Revised: 03/10/2016 Document Reviewed: 03/10/2016 Elsevier Interactive Patient Education  2017 [place]: At [facility], you and your health needs are our priority.  As part of our continuing mission to provide you with  exceptional heart care, we have created designated Provider Care Teams.  These Care Teams include your primary Cardiologist (physician) and Advanced Practice Providers (APPs -  Physician Assistants and Nurse Practitioners) who all work together to provide you with the care you need,  when you need it.  We recommend signing up for the patient portal called "MyChart".  Sign up information is provided on this After Visit Summary.  MyChart is used to connect with patients for Virtual Visits (Telemedicine).  Patients are able to view lab/test results, encounter notes, upcoming appointments, etc.  Non-urgent messages can be sent to your provider as well.   To learn more about what you can do with MyChart, go to [email or web address].    Your next appointment:   6 month(s)  The format for your next appointment:   In Person  Provider:   Quay Burow, MD

## 2022-03-19 NOTE — Progress Notes (Signed)
03/19/2022 Corey Nielsen   04-27-1946  [id number]  Primary Physician Lavone Orn, MD Primary Cardiologist: Lorretta Harp MD Lupe Carney, [place]  HPI:  Corey Nielsen is a 76 y.o. 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  He was referred by his PCP because of a recent episode of atypical chest pain.  His risk factors include remote tobacco abuse having quit in 1988.  He has treated hyperlipidemia and diabetes.  There is no family history for heart disease.  Is never had heart attack or stroke.  He had a prolonged episode of chest pain last Friday night lasting 5 to 6 hours which resolved spontaneously.  There were no other associated symptoms.  EMS was called but his pain resolved by the time he arrived.  He had no recurrence.   Current Meds  Medication Sig   atorvastatin (LIPITOR) 20 MG tablet TAKE 1 TABLET EACH DAY   Coenzyme Q10 (CO Q 10 PO) Take 300 mg by mouth daily.   COVID-19 mRNA vaccine, Moderna, (MODERNA COVID-19 VACCINE) 100 MCG/0.5ML injection Inject into the muscle.   Dutasteride-Tamsulosin HCl (JALYN) 0.5-0.4 MG CAPS TAKE ONE (1) CAPSULE EACHDAY   Insulin Human (INSULIN PUMP) 100 unit/ml SOLN Inject into the skin. Insulin pump with novolog insulin   metFORMIN (GLUCOPHAGE-XR) 500 MG 24 hr tablet Take 500 mg by mouth in the morning and at bedtime.   Multiple Vitamins-Minerals (MULTIVITAMIN ADULTS PO) Take 1 tablet by mouth daily.   ONETOUCH DELICA LANCETS 72Z MISC U UTD TO TEST BLOOD SUGARS 8 TIMES A DAY   ONETOUCH VERIO test strip U TO TEST EIGHT TIMES D UTD   pioglitazone (ACTOS) 30 MG tablet Take 1 tablet (30 mg total) by mouth daily.   VASCEPA 1 g capsule TAKE 2 CAPSULES BY MOUTH TWICE DAILY WITH MEALS FOR TRIGLYCERIDES   Current Facility-Administered Medications for the 03/19/22 encounter (Office Visit) with Lorretta Harp, MD  Medication   triamcinolone acetonide  (KENALOG) 10 MG/ML injection 10 mg     Allergies  Allergen Reactions   Tetanus Toxoids Shortness Of Breath    Horse serum   Amoxicillin     Social History   Socioeconomic History   Marital status: Single    Spouse name: Not on file   Number of children: Not on file   Years of education: Not on file   Highest education level: Not on file  Occupational History   Not on file  Tobacco Use   Smoking status: Former    Packs/day: 0.50    Years: 25.00    Total pack years: 12.50    Types: Cigarettes    Quit date: 05/05/1986    Years since quitting: 35.8   Smokeless tobacco: Never  Substance and Sexual Activity   Alcohol use: Yes    Comment: rare   Drug use: No   Sexual activity: Not on file  Other Topics Concern   Not on file  Social History Narrative   Not on file   Social Determinants of Health   Financial Resource Strain: Not on file  Food Insecurity: Not on file  Transportation Needs: Not on file  Physical Activity: Not on file  Stress: Not on file  Social Connections: Not on file  Intimate Partner Violence: Not on file     Review of Systems: General: negative for chills, fever, night sweats or weight changes.  Cardiovascular: negative for chest pain, dyspnea on exertion, edema, orthopnea, palpitations, paroxysmal nocturnal dyspnea or shortness of breath Dermatological: negative for rash Respiratory: negative for cough or wheezing Urologic: negative for hematuria Abdominal: negative for nausea, vomiting, diarrhea, bright red blood per rectum, melena, or hematemesis Neurologic: negative for visual changes, syncope, or dizziness All other systems reviewed and are otherwise negative except as noted above.    Blood pressure (!) 144/66, pulse 72, height '6\' 4"'$  (1.93 m), weight 229 lb (103.9 kg), SpO2 99 %.  General appearance: alert and no distress Neck: no adenopathy, no carotid bruit, no JVD, supple, symmetrical, trachea midline, and thyroid not enlarged,  symmetric, no tenderness/mass/nodules Lungs: clear to auscultation bilaterally Heart: regular rate and rhythm, S1, S2 normal, no murmur, click, rub or gallop Extremities: extremities normal, atraumatic, no cyanosis or edema Pulses: 2+ and symmetric Skin: Skin color, texture, turgor normal. No rashes or lesions Neurologic: Grossly normal  EKG sinus rhythm at 72 without ST or T wave changes.  Personally reviewed this EKG.  ASSESSMENT AND PLAN:   HYPERLIPIDEMIA History of hyperlipidemia on statin therapy with lipid profile performed 03/14/2022 revealing total cholesterol 122, LDL 54 and HDL of 50.  Atypical chest pain Mr. Samara Snide was referred to me by Dr.Varadarajan, his PCP, for atypical chest pain.  He does have a history of diabetes and hyperlipidemia but no family history.  The pain occurred last Friday night and lasted for several hours.  It was somewhat atypical and localized.  There is no radiation or other associated symptoms.  It was his first and only episode.  It may have been gastrointestinal or related to a antibiotic that he was on.  I am going to get a coronary calcium score to further evaluate.     Lorretta Harp MD FACP,FACC,FAHA, [facility] 03/19/2022 11:37 AM

## 2022-03-19 NOTE — Assessment & Plan Note (Signed)
Mr. Corey Nielsen was referred to me by Dr.Varadarajan, his PCP, for atypical chest pain.  He does have a history of diabetes and hyperlipidemia but no family history.  The pain occurred last Friday night and lasted for several hours.  It was somewhat atypical and localized.  There is no radiation or other associated symptoms.  It was his first and only episode.  It may have been gastrointestinal or related to a antibiotic that he was on.  I am going to get a coronary calcium score to further evaluate.

## 2022-03-19 NOTE — Assessment & Plan Note (Signed)
History of hyperlipidemia on statin therapy with lipid profile performed 03/14/2022 revealing total cholesterol 122, LDL 54 and HDL of 50.

## 2022-03-21 DIAGNOSIS — E113293 Type 2 diabetes mellitus with mild nonproliferative diabetic retinopathy without macular edema, bilateral: Secondary | ICD-10-CM | POA: Diagnosis not present

## 2022-03-21 DIAGNOSIS — H2513 Age-related nuclear cataract, bilateral: Secondary | ICD-10-CM | POA: Diagnosis not present

## 2022-03-21 DIAGNOSIS — H35372 Puckering of macula, left eye: Secondary | ICD-10-CM | POA: Diagnosis not present

## 2022-03-21 DIAGNOSIS — Z794 Long term (current) use of insulin: Secondary | ICD-10-CM | POA: Diagnosis not present

## 2022-03-24 DIAGNOSIS — E113293 Type 2 diabetes mellitus with mild nonproliferative diabetic retinopathy without macular edema, bilateral: Secondary | ICD-10-CM | POA: Diagnosis not present

## 2022-03-24 DIAGNOSIS — E1165 Type 2 diabetes mellitus with hyperglycemia: Secondary | ICD-10-CM | POA: Diagnosis not present

## 2022-03-24 DIAGNOSIS — E785 Hyperlipidemia, unspecified: Secondary | ICD-10-CM | POA: Diagnosis not present

## 2022-03-24 DIAGNOSIS — I129 Hypertensive chronic kidney disease with stage 1 through stage 4 chronic kidney disease, or unspecified chronic kidney disease: Secondary | ICD-10-CM | POA: Diagnosis not present

## 2022-03-24 DIAGNOSIS — E1142 Type 2 diabetes mellitus with diabetic polyneuropathy: Secondary | ICD-10-CM | POA: Diagnosis not present

## 2022-03-24 DIAGNOSIS — E11649 Type 2 diabetes mellitus with hypoglycemia without coma: Secondary | ICD-10-CM | POA: Diagnosis not present

## 2022-03-24 DIAGNOSIS — N182 Chronic kidney disease, stage 2 (mild): Secondary | ICD-10-CM | POA: Diagnosis not present

## 2022-03-24 DIAGNOSIS — E559 Vitamin D deficiency, unspecified: Secondary | ICD-10-CM | POA: Diagnosis not present

## 2022-03-24 DIAGNOSIS — E1122 Type 2 diabetes mellitus with diabetic chronic kidney disease: Secondary | ICD-10-CM | POA: Diagnosis not present

## 2022-04-01 DIAGNOSIS — Z09 Encounter for follow-up examination after completed treatment for conditions other than malignant neoplasm: Secondary | ICD-10-CM | POA: Diagnosis not present

## 2022-04-07 ENCOUNTER — Ambulatory Visit (HOSPITAL_BASED_OUTPATIENT_CLINIC_OR_DEPARTMENT_OTHER)
Admission: RE | Admit: 2022-04-07 | Discharge: 2022-04-07 | Disposition: A | Discharge status: Home / Self Care | Payer: Medicare PPO | Source: Ambulatory Visit | Attending: Cardiovascular Disease | Admitting: Cardiovascular Disease

## 2022-04-07 DIAGNOSIS — E785 Hyperlipidemia, unspecified: Secondary | ICD-10-CM | POA: Insufficient documentation

## 2022-04-07 DIAGNOSIS — R0789 Other chest pain: Secondary | ICD-10-CM | POA: Insufficient documentation

## 2022-04-10 DIAGNOSIS — E1165 Type 2 diabetes mellitus with hyperglycemia: Secondary | ICD-10-CM | POA: Diagnosis not present

## 2022-04-18 ENCOUNTER — Ambulatory Visit
Admission: RE | Admit: 2022-04-18 | Discharge: 2022-04-18 | Disposition: A | Discharge status: Home / Self Care | Payer: Medicare PPO | Source: Ambulatory Visit | Attending: Internal Medicine | Admitting: Internal Medicine

## 2022-04-18 DIAGNOSIS — K449 Diaphragmatic hernia without obstruction or gangrene: Secondary | ICD-10-CM | POA: Diagnosis not present

## 2022-04-18 DIAGNOSIS — I700 Atherosclerosis of aorta: Secondary | ICD-10-CM | POA: Diagnosis not present

## 2022-04-18 DIAGNOSIS — R1013 Epigastric pain: Secondary | ICD-10-CM

## 2022-04-18 DIAGNOSIS — K8020 Calculus of gallbladder without cholecystitis without obstruction: Secondary | ICD-10-CM | POA: Diagnosis not present

## 2022-04-18 DIAGNOSIS — G8929 Other chronic pain: Secondary | ICD-10-CM | POA: Diagnosis not present

## 2022-04-18 MED ORDER — IOPAMIDOL (ISOVUE-300) INJECTION 61%
100.0000 mL | Freq: Once | INTRAVENOUS | Status: AC | PRN
  Administered 2022-04-18: 100 mL via INTRAVENOUS

## 2022-04-30 ENCOUNTER — Telehealth: Payer: Self-pay

## 2022-04-30 DIAGNOSIS — R072 Precordial pain: Secondary | ICD-10-CM

## 2022-04-30 MED ORDER — METOPROLOL TARTRATE 100 MG PO TABS: 100.0000 mg | ORAL_TABLET | Freq: Once | ORAL | 0 refills | Status: DC

## 2022-04-30 NOTE — Telephone Encounter (Addendum)
Spoke with pt regarding coronary CTA. Orders placed and instructions were reviewed with the pt. Lab orders placed and pt will plan to get those done within a few days of the CCTA. Lopressor sent to pt's pharmacy of choice. Instructions sent to pt via Mychart portal. He will let us know if any questions arise. Pt verbalizes understanding.   ----- Message from Lorretta Harp, MD sent at 04/07/2022 12:18 PM EST ----- CCS 539. Needs Cor CTA then ROV

## 2022-05-09 IMAGING — DX DG ANKLE COMPLETE 3+V*L*
3 series · 3 of 3 positions shown · non-contrast
Comparison: None.

CLINICAL DATA: Fall several days ago with left ankle pain and
bruising, initial encounter

EXAM:
LEFT ANKLE COMPLETE - 3+ VIEW

[dg ankle 2 views left (1 of 3)]
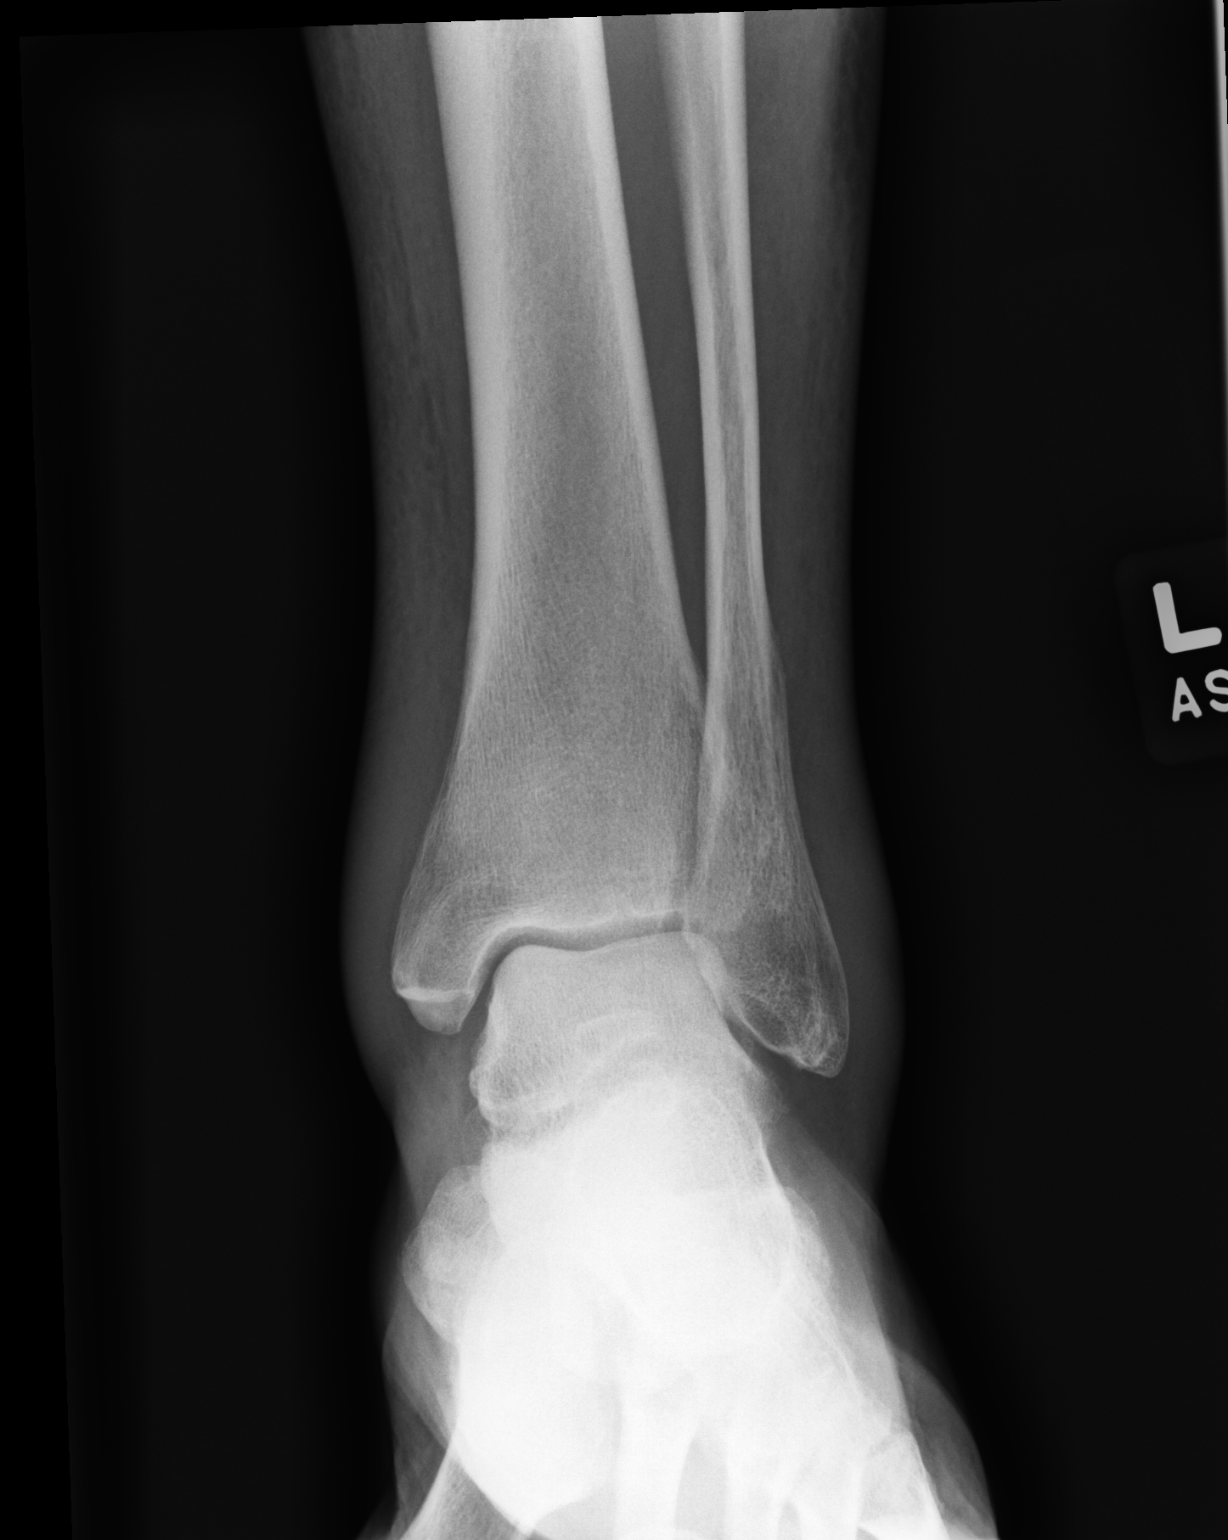

[dg ankle 2 views left (2 of 3)]
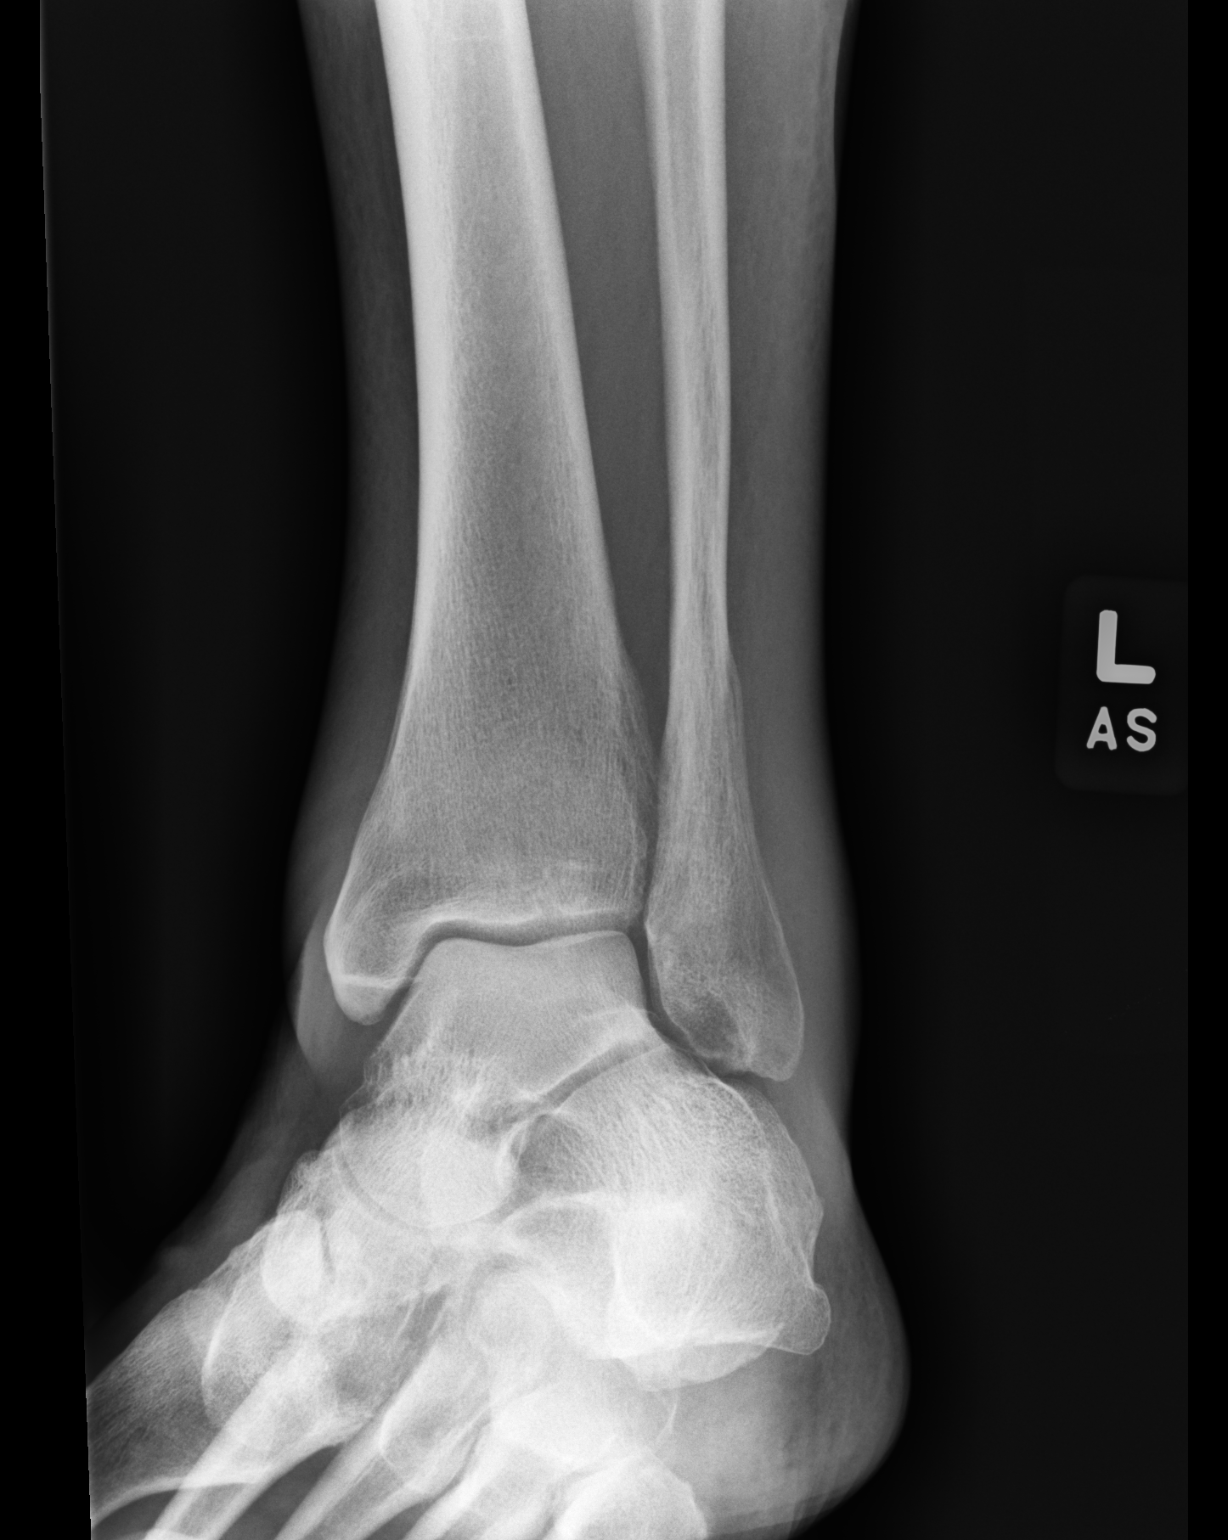

[dg ankle 2 views left (3 of 3)]
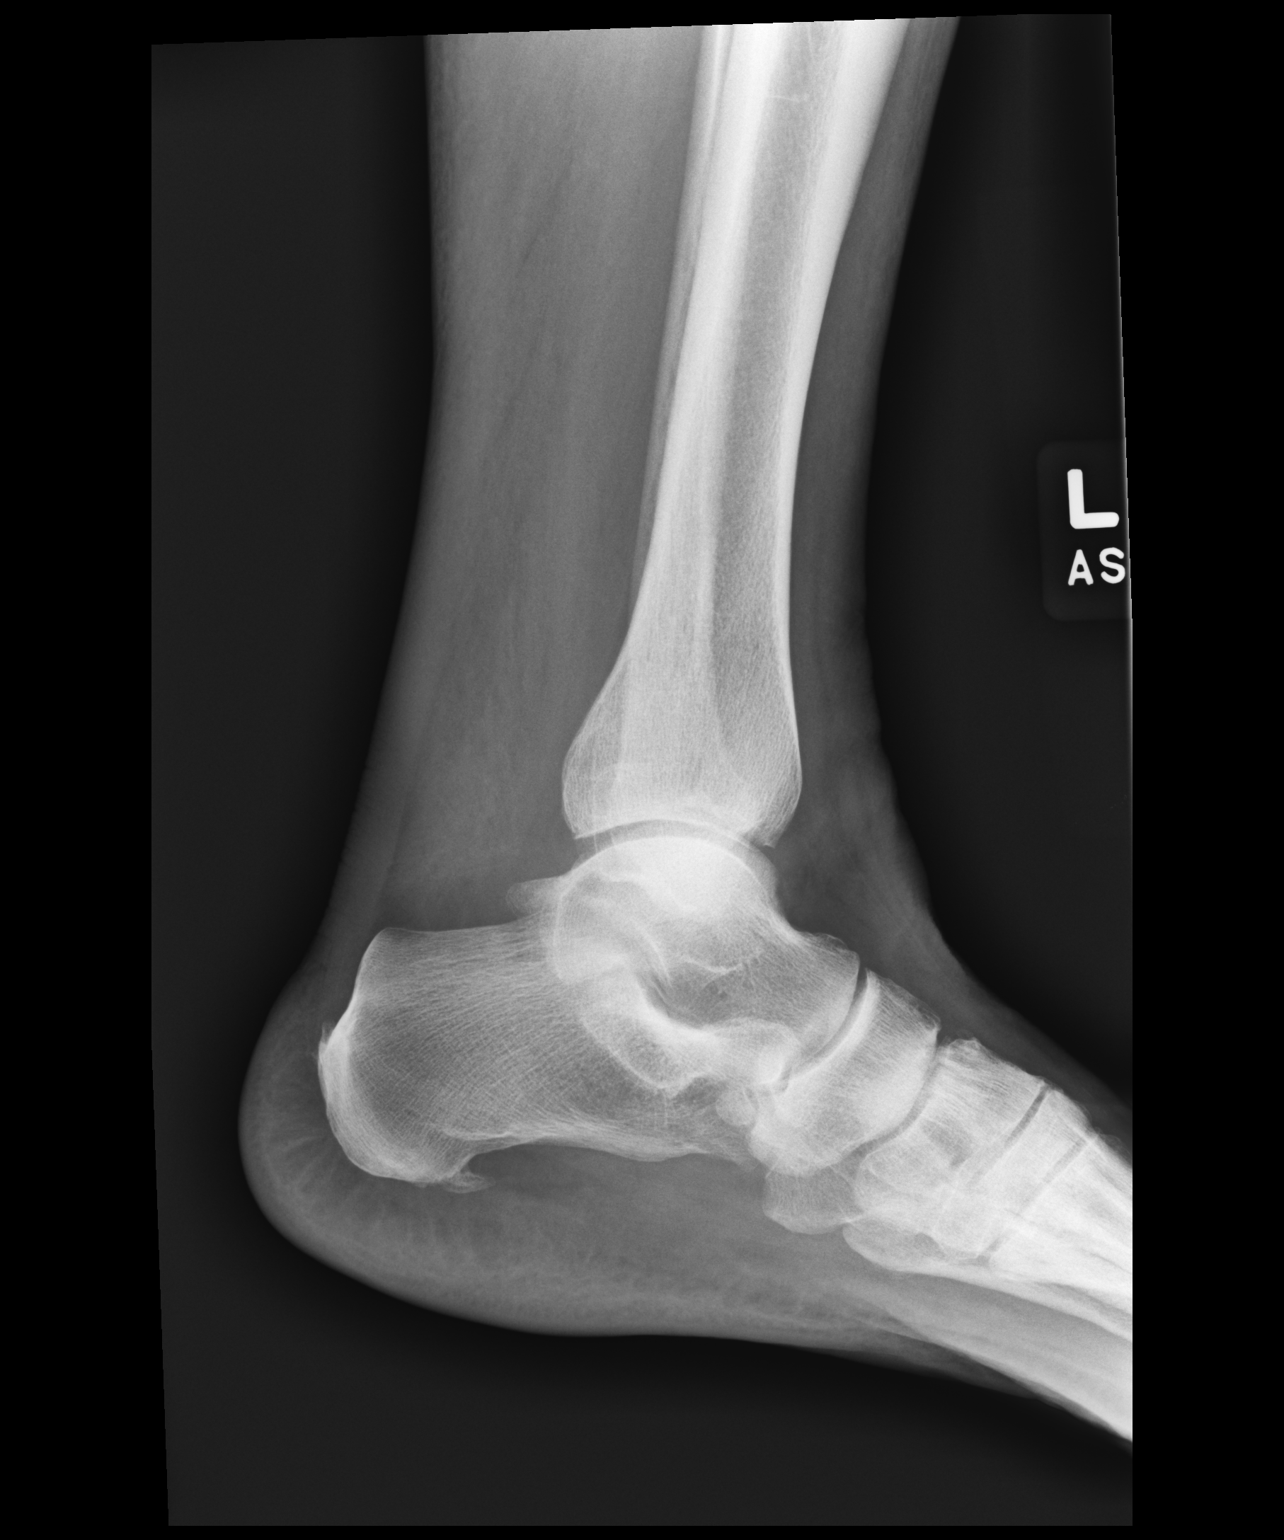

[3 of 3 positions shown; findings below may reference images not displayed]

FINDINGS: Generalized soft tissue swelling is noted about the ankle. No acute
fracture or dislocation is noted. Calcaneal spurring is noted.
IMPRESSION: Degenerative change without acute bony abnormality.

Generalized soft tissue swelling is seen.

## 2022-05-23 ENCOUNTER — Telehealth (HOSPITAL_COMMUNITY): Payer: Self-pay | Admitting: Emergency Medicine

## 2022-05-23 DIAGNOSIS — R079 Chest pain, unspecified: Secondary | ICD-10-CM

## 2022-05-23 LAB — BASIC METABOLIC PANEL
BUN/Creatinine Ratio: 18 (ref 10–24)
BUN: 20 mg/dL (ref 8–27)
CO2: 20 mmol/L (ref 20–29)
Calcium: 9.3 mg/dL (ref 8.6–10.2)
Chloride: 103 mmol/L (ref 96–106)
Creatinine, Ser: 1.14 mg/dL (ref 0.76–1.27)
Glucose: 232 mg/dL — ABNORMAL HIGH (ref 70–99)
Potassium: 4.5 mmol/L (ref 3.5–5.2)
Sodium: 137 mmol/L (ref 134–144)
eGFR: 66 mL/min/{1.73_m2} (ref >59)

## 2022-05-23 MED ORDER — METOPROLOL TARTRATE 100 MG PO TABS: 100.0000 mg | ORAL_TABLET | Freq: Once | ORAL | 0 refills | Status: DC

## 2022-05-23 NOTE — Telephone Encounter (Signed)
Reaching out to patient to offer assistance regarding upcoming cardiac imaging study; pt verbalizes understanding of appt date/time, parking situation and where to check in, pre-test NPO status and medications ordered, and verified current allergies; name and call back number provided for further questions should they arise Marchia Bond RN Navigator Cardiac Imaging [facility] [phone] office [phone] cell  Arrival 800 '100mg'$  metoprolol tartrate Getting labs today Aware contrast/nitro Denies iv issues

## 2022-05-27 ENCOUNTER — Ambulatory Visit (HOSPITAL_COMMUNITY)
Admission: RE | Admit: 2022-05-27 | Discharge: 2022-05-27 | Disposition: A | Discharge status: Home / Self Care | Payer: Medicare PPO | Source: Ambulatory Visit | Attending: Cardiovascular Disease | Admitting: Cardiovascular Disease

## 2022-05-27 ENCOUNTER — Other Ambulatory Visit: Payer: Self-pay | Admitting: Cardiology

## 2022-05-27 DIAGNOSIS — R931 Abnormal findings on diagnostic imaging of heart and coronary circulation: Secondary | ICD-10-CM

## 2022-05-27 DIAGNOSIS — R072 Precordial pain: Secondary | ICD-10-CM

## 2022-05-27 MED ORDER — IOHEXOL 350 MG/ML SOLN
100.0000 mL | Freq: Once | INTRAVENOUS | Status: AC | PRN
  Administered 2022-05-27: 100 mL via INTRAVENOUS

## 2022-05-27 MED ORDER — NITROGLYCERIN 0.4 MG SL SUBL
SUBLINGUAL_TABLET | SUBLINGUAL | Status: AC
  Filled 2022-05-27: qty 2

## 2022-05-27 MED ORDER — NITROGLYCERIN 0.4 MG SL SUBL
0.8000 mg | SUBLINGUAL_TABLET | Freq: Once | SUBLINGUAL | Status: AC
  Administered 2022-05-27: 0.8 mg via SUBLINGUAL

## 2022-05-31 ENCOUNTER — Other Ambulatory Visit: Payer: Self-pay | Admitting: Cardiology

## 2022-05-31 DIAGNOSIS — R931 Abnormal findings on diagnostic imaging of heart and coronary circulation: Secondary | ICD-10-CM

## 2022-06-02 ENCOUNTER — Ambulatory Visit (HOSPITAL_COMMUNITY)
Admission: RE | Admit: 2022-06-02 | Discharge: 2022-06-02 | Disposition: A | Discharge status: Home / Self Care | Payer: Medicare PPO | Source: Ambulatory Visit | Attending: Cardiology | Admitting: Cardiology

## 2022-06-02 DIAGNOSIS — R931 Abnormal findings on diagnostic imaging of heart and coronary circulation: Secondary | ICD-10-CM

## 2022-06-11 ENCOUNTER — Encounter: Payer: Self-pay | Admitting: Cardiovascular Disease

## 2022-06-11 NOTE — Telephone Encounter (Signed)
Lorretta Harp, MD  You; Callie Fielding D, [place] minutes ago (5:18 PM)    CCS moderately elevated. FFR analysis doesn't show signif flow limitation. FLP appropriate for secondary prevention. OK to F/U in May.   Spoke with pt regarding recent imaging. With the exception of one finding pt does feel like it is ok to wait until follow up in May. Will follow up with Dr. Gwenlyn Found to advise.

## 2022-06-11 NOTE — Telephone Encounter (Signed)
Over-read of CCS:   FINDINGS: Vascular: No acute non-cardiac vascular finding.   Mediastinum/Nodes: Partially visualized fluid density lesion along the posterior left lateral margin of the esophagus measures 23 x 15 mm on image 1/3.   Lungs/Pleura: Scattered atelectasis/scarring.   Upper Abdomen: No acute abnormality.   Musculoskeletal: Multilevel degenerative changes spine.   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 Consider further evaluation by dedicated chest CT with contrast.     Electronically Signed   By: Dahlia Bailiff M.D.   On: 04/07/2022 12:28  Pt was concerned with these findings. Will send to provider to advise. Pt verbalizes understanding

## 2022-06-18 ENCOUNTER — Other Ambulatory Visit: Payer: Self-pay | Admitting: Internal Medicine

## 2022-06-18 DIAGNOSIS — K2289 Other specified disease of esophagus: Secondary | ICD-10-CM

## 2022-07-04 DIAGNOSIS — E1122 Type 2 diabetes mellitus with diabetic chronic kidney disease: Secondary | ICD-10-CM | POA: Diagnosis not present

## 2022-07-04 DIAGNOSIS — N182 Chronic kidney disease, stage 2 (mild): Secondary | ICD-10-CM | POA: Diagnosis not present

## 2022-07-04 DIAGNOSIS — Z794 Long term (current) use of insulin: Secondary | ICD-10-CM | POA: Diagnosis not present

## 2022-07-04 DIAGNOSIS — E1142 Type 2 diabetes mellitus with diabetic polyneuropathy: Secondary | ICD-10-CM | POA: Diagnosis not present

## 2022-07-04 DIAGNOSIS — Z7984 Long term (current) use of oral hypoglycemic drugs: Secondary | ICD-10-CM | POA: Diagnosis not present

## 2022-07-04 DIAGNOSIS — Z9641 Presence of insulin pump (external) (internal): Secondary | ICD-10-CM | POA: Diagnosis not present

## 2022-07-04 DIAGNOSIS — E113293 Type 2 diabetes mellitus with mild nonproliferative diabetic retinopathy without macular edema, bilateral: Secondary | ICD-10-CM | POA: Diagnosis not present

## 2022-07-04 DIAGNOSIS — I129 Hypertensive chronic kidney disease with stage 1 through stage 4 chronic kidney disease, or unspecified chronic kidney disease: Secondary | ICD-10-CM | POA: Diagnosis not present

## 2022-07-04 DIAGNOSIS — E1165 Type 2 diabetes mellitus with hyperglycemia: Secondary | ICD-10-CM | POA: Diagnosis not present

## 2022-07-17 DIAGNOSIS — E11621 Type 2 diabetes mellitus with foot ulcer: Secondary | ICD-10-CM | POA: Diagnosis not present

## 2022-07-18 ENCOUNTER — Ambulatory Visit
Admission: RE | Admit: 2022-07-18 | Discharge: 2022-07-18 | Disposition: A | Discharge status: Home / Self Care | Payer: Medicare PPO | Source: Ambulatory Visit | Attending: Internal Medicine | Admitting: Internal Medicine

## 2022-07-18 DIAGNOSIS — K2289 Other specified disease of esophagus: Secondary | ICD-10-CM

## 2022-07-18 DIAGNOSIS — J439 Emphysema, unspecified: Secondary | ICD-10-CM | POA: Diagnosis not present

## 2022-07-18 DIAGNOSIS — J9811 Atelectasis: Secondary | ICD-10-CM | POA: Diagnosis not present

## 2022-07-18 DIAGNOSIS — K449 Diaphragmatic hernia without obstruction or gangrene: Secondary | ICD-10-CM | POA: Diagnosis not present

## 2022-07-18 DIAGNOSIS — I700 Atherosclerosis of aorta: Secondary | ICD-10-CM | POA: Diagnosis not present

## 2022-07-18 MED ORDER — IOPAMIDOL (ISOVUE-300) INJECTION 61%
75.0000 mL | Freq: Once | INTRAVENOUS | Status: AC | PRN
  Administered 2022-07-18: 75 mL via INTRAVENOUS

## 2022-07-29 ENCOUNTER — Telehealth: Payer: Self-pay

## 2022-07-29 NOTE — Patient Outreach (Signed)
  Care Coordination   07/29/2022 Name: Corey Nielsen MRN: [id number] DOB: June 09, 1945   Care Coordination Outreach Attempts:  An unsuccessful telephone outreach was attempted today to offer the patient information about available care coordination services as a benefit of their health plan.   Follow Up Plan:  Additional outreach attempts will be made to offer the patient care coordination information and services.   Encounter Outcome:  No Answer   Care Coordination Interventions:  No, not indicated    SIG  Peter Garter RN, BSN,CCM, CDE Care Management Coordinator [facility] [phone]

## 2022-08-08 ENCOUNTER — Telehealth: Payer: Self-pay

## 2022-08-08 NOTE — Patient Outreach (Signed)
  Care Coordination   08/08/2022 Name: JOAOPEDRO JULICH MRN: [id number] DOB: [birth year]   Care Coordination Outreach Attempts:  A second unsuccessful outreach was attempted today to offer the patient with information about available care coordination services as a benefit of their health plan.     Follow Up Plan:  Additional outreach attempts will be made to offer the patient care coordination information and services.   Encounter Outcome:  No Answer   Care Coordination Interventions:  No, not indicated    SIG  Dudley Major RN, BSN,CCM, CDE Care Management Coordinator [facility] [phone]

## 2022-08-12 DIAGNOSIS — E1165 Type 2 diabetes mellitus with hyperglycemia: Secondary | ICD-10-CM | POA: Diagnosis not present

## 2022-08-21 ENCOUNTER — Telehealth: Payer: Self-pay

## 2022-08-21 NOTE — Patient Outreach (Signed)
  Care Coordination   08/21/2022 Name: Corey Nielsen MRN: [id number] DOB: 01-23-1946   Care Coordination Outreach Attempts:  A third unsuccessful outreach was attempted today to offer the patient with information about available care coordination services as a benefit of their health plan.   Follow Up Plan:  No further outreach attempts will be made at this time. We have been unable to contact the patient to offer or enroll patient in care coordination services  Encounter Outcome:  No Answer   Care Coordination Interventions:  No, not indicated    SIG  Dudley Major RN, BSN,CCM, CDE Care Management Coordinator [facility] [phone]

## 2022-09-05 DIAGNOSIS — R972 Elevated prostate specific antigen [PSA]: Secondary | ICD-10-CM | POA: Diagnosis not present

## 2022-09-17 ENCOUNTER — Ambulatory Visit: Payer: Medicare PPO | Admitting: Cardiovascular Disease

## 2022-09-18 DIAGNOSIS — R3915 Urgency of urination: Secondary | ICD-10-CM | POA: Diagnosis not present

## 2022-09-18 DIAGNOSIS — N401 Enlarged prostate with lower urinary tract symptoms: Secondary | ICD-10-CM | POA: Diagnosis not present

## 2022-09-22 DIAGNOSIS — E1165 Type 2 diabetes mellitus with hyperglycemia: Secondary | ICD-10-CM | POA: Diagnosis not present

## 2022-10-29 NOTE — Progress Notes (Signed)
Cardiology Clinic Note   Patient Name: Corey Nielsen Date of Encounter: 10/31/2022  Primary Care Provider:  Kirby Funk, MD (Inactive) Primary Cardiologist: Dr.Berry  Patient Profile    Corey Nielsen 77 year old male presents to the clinic today for follow-up evaluation of his coronary artery disease  Past Medical History    Past Medical History:  Diagnosis Date   BENIGN PROSTATIC HYPERTROPHY 02/05/2007   DIABETES MELLITUS, TYPE II 02/05/2007   Type 1   Headache    migraine occasionally   History of swelling of feet    HYPERLIPIDEMIA 02/05/2007   Hypertension    Past Surgical History:  Procedure Laterality Date   COLONOSCOPY WITH PROPOFOL N/A 05/29/2014   Procedure: COLONOSCOPY WITH PROPOFOL;  Surgeon: Charolett Bumpers, MD;  Location: [facility];  Service: Endoscopy;  Laterality: N/A;   KIDNEY STONE SURGERY  2007   NASAL SINUS SURGERY  1989   obstructing growth ( Dr.  Dorisann Frames)   UMBILICAL HERNIA REPAIR  1996   Dr Earlene Plater    Allergies  Allergies  Allergen Reactions   Tetanus Toxoids Shortness Of Breath    Horse serum   Amoxicillin    Tetanus Immune Globulin Anxiety    Other Reaction(s): Angioedema    History of Present Illness    Corey Nielsen has a PMH of hyperlipidemia, coronary artery disease, atypical chest pain, and diabetes type 2.  Coronary CTA 05/27/2022 showed a coronary calcium score of 407.  FFR analysis reassuring.   He was seen in follow-up by Dr. Allyson Sabal 03/19/2022.  During that time he reported a recent episode of chest discomfort.  He indicated that he was a former smoker but quit using tobacco in 1988.  He was being treated for hyperlipidemia.  He denied family history of heart disease.  He denied history of heart attack and stroke.  He reported an episode of chest discomfort that lasted 5 to 6 hours and resolved spontaneously.  He denied other associated symptoms.  EMS was called and his pain had resolved by the time they presented.  He denied  further episodes of chest discomfort.  A coronary CTA was ordered and showed a coronary calcium score of 407.  His FFR analysis was reassuring.  He presents to the clinic today for follow-up evaluation and states he feels well.  We reviewed his coronary CTA and FFR.  He expressed understanding.  I reviewed his current medication regimen and provided recommendations for fiber in his diet.  Will plan follow-up in 9 to 12 months.  Today he denies chest pain, shortness of breath, lower extremity edema, fatigue, palpitations, melena, hematuria, hemoptysis, diaphoresis, weakness, presyncope, syncope, orthopnea, and PND.   Home Medications    Prior to Admission medications   Medication Sig Start Date End Date Taking? Authorizing Provider  atorvastatin (LIPITOR) 20 MG tablet TAKE 1 TABLET EACH DAY 09/30/12   Stacie Glaze, MD  Coenzyme Q10 (CO Q 10 PO) Take 300 mg by mouth daily.    [provider]  COVID-19 mRNA vaccine, Moderna, (MODERNA COVID-19 VACCINE) 100 MCG/0.5ML injection Inject into the muscle. 09/03/20   Judyann Munson, MD  Dutasteride-Tamsulosin HCl Haynes Bast) 0.5-0.4 MG CAPS TAKE ONE (1) CAPSULE EACHDAY 09/30/12   Stacie Glaze, MD  Insulin Human (INSULIN PUMP) 100 unit/ml SOLN Inject into the skin. Insulin pump with novolog insulin    Stacie Glaze, MD  metFORMIN (GLUCOPHAGE-XR) 500 MG 24 hr tablet Take 500 mg by mouth in the morning and  at bedtime. 08/30/21   [provider]  metoprolol tartrate (LOPRESSOR) 100 MG tablet Take 1 tablet (100 mg total) by mouth once for 1 dose. Take 2 hours prior to procedure. 05/23/22 05/23/22  Runell Gess, MD  Multiple Vitamins-Minerals (MULTIVITAMIN ADULTS PO) Take 1 tablet by mouth daily.    [provider]  [facility] DELICA LANCETS 33G MISC U UTD TO TEST BLOOD SUGARS 8 TIMES A DAY 11/01/15   [provider]  ONETOUCH VERIO test strip U TO TEST EIGHT TIMES D UTD 10/13/15   [provider]  pioglitazone (ACTOS)  30 MG tablet Take 1 tablet (30 mg total) by mouth daily. 09/30/12   Stacie Glaze, MD  Saxagliptin-Metformin (KOMBIGLYZE XR) 09-998 MG TB24 Take 1 tablet by mouth daily. Patient not taking: Reported on 03/19/2022    [provider]  VASCEPA 1 g capsule TAKE 2 CAPSULES BY MOUTH TWICE DAILY WITH MEALS FOR TRIGLYCERIDES 08/07/21   [provider]    Family History    Family History  Problem Relation Age of Onset   Diabetes Mother    Heart disease Father    Early death Brother    He indicated that his mother is deceased. He indicated that his father is deceased. He indicated that his brother is deceased.  Social History    Social History   Socioeconomic History   Marital status: Widowed    Spouse name: Not on file   Number of children: Not on file   Years of education: Not on file   Highest education level: Not on file  Occupational History   Not on file  Tobacco Use   Smoking status: Former    Packs/day: 0.50    Years: 25.00    Additional pack years: 0.00    Total pack years: 12.50    Types: Cigarettes    Quit date: 05/05/1986    Years since quitting: 36.5   Smokeless tobacco: Never  Substance and Sexual Activity   Alcohol use: Yes    Comment: rare   Drug use: No   Sexual activity: Not on file  Other Topics Concern   Not on file  Social History Narrative   Not on file   Social Determinants of Health   Financial Resource Strain: Not on file  Food Insecurity: Not on file  Transportation Needs: Not on file  Physical Activity: Not on file  Stress: Not on file  Social Connections: Not on file  Intimate Partner Violence: Not on file     Review of Systems    General:  No chills, fever, night sweats or weight changes.  Cardiovascular:  No chest pain, dyspnea on exertion, edema, orthopnea, palpitations, paroxysmal nocturnal dyspnea. Dermatological: No rash, lesions/masses Respiratory: No cough, dyspnea Urologic: No hematuria, dysuria Abdominal:    No nausea, vomiting, diarrhea, bright red blood per rectum, melena, or hematemesis Neurologic:  No visual changes, wkns, changes in mental status. All other systems reviewed and are otherwise negative except as noted above.  Physical Exam    VS:  BP 130/62   Pulse 83   Ht 6\' 5"  (1.956 m)   Wt 223 lb (101.2 kg)   SpO2 98%   BMI 26.44 kg/m  , BMI Body mass index is 26.44 kg/m. GEN: Well nourished, well developed, in no acute distress. HEENT: normal. Neck: Supple, no JVD, carotid bruits, or masses. Cardiac: RRR, no murmurs, rubs, or gallops. No clubbing, cyanosis, edema.  Radials/DP/PT 2+ and equal bilaterally.  Respiratory:  Respirations regular and unlabored, clear to auscultation bilaterally. GI: Soft, nontender, nondistended, BS + x 4. MS: no deformity or atrophy. Skin: warm and dry, no rash. Neuro:  Strength and sensation are intact. Psych: Normal affect.  Accessory Clinical Findings    Recent Labs: 05/23/2022: BUN 20; Creatinine, Ser 1.14; Potassium 4.5; Sodium 137   Recent Lipid Panel    Component Value Date/Time   CHOL 129 10/16/2011 0753   TRIG 93.0 10/16/2011 0753   HDL 48.10 10/16/2011 0753   CHOLHDL 3 10/16/2011 0753   VLDL 18.6 10/16/2011 0753   LDLCALC 62 10/16/2011 0753         ECG personally reviewed by me today-none today.    Coronary CTA 05/27/2022 Image quality: Fair, Misregistration artifact.   Aorta: Normal size.  No calcifications.  No dissection.   Aortic Valve:  Trileaflet.  No calcifications.   Coronary Arteries:  Normal coronary origin.  Right dominance.   RCA is a large dominant artery that gives rise to PDA and PLA. There is diffuse minimal (<24%) calcified plaqued through out the RCA.   Left main is a large artery that gives rise to LAD and LCX arteries.   LAD is a large vessel. There is moderate (50-69%) calcified plaque in the proximal LAD. The mid LAD with mild (25-49%) calcified plaque. The distal LAD with mild scattered  calcified plaque.   LCX is a non-dominant artery that gives rise to one large OM1 branch. There is minimal (<24%) calcified plaque.   Coronary Calcium Score:   Left main: 0   Left anterior descending artery: 377   Left circumflex artery: 0   Right coronary artery: 29   Total: 407   Percentile: 56   Other findings:   Normal pulmonary vein drainage into the left atrium.   Normal left atrial appendage without a thrombus.   Normal size of the pulmonary artery.   IMPRESSION: 1. Coronary calcium score of 407. This was 72 percentile for age and sex matched control.   2. Normal coronary origin with right dominance.   3. CAD-RADS 3. Moderate stenosis. Consider symptom-guided anti-ischemic pharmacotherapy as well as risk factor modification per guideline directed care. Additional analysis with CT FFR will be submitted.   The noncardiac portion of this study will be interpreted in separate report by the radiologist.   Electronically Signed: By: Thomasene Ripple D.O. On: 05/27/2022 12:04    FFR 06/02/2022 EXAM: CT FFR ANALYSIS   CLINICAL DATA:  CAD   FINDINGS: FFRct analysis was performed on the original cardiac CT angiogram dataset. Diagrammatic representation of the FFRct analysis is provided in a separate PDF document in PACS. This dictation was created using the PDF document and an interactive 3D model of the results. 3D model is not available in the EMR/PACS. Normal FFR range is >0.80. Indeterminate (grey) zone is 0.76-0.80.   1. Left Main: FFR = 0.98   2. LAD: Proximal FFR = 0.96, mid FFR = 0.91, distal FFR = 0.80 3. LCX: Proximal FFR = 0.98, distal FFR = 0.96 4. RCA: Not analyzed   IMPRESSION: 1.  CT FFR analysis showed no significant stenosis.   RECOMMENDATIONS: Guideline-directed medical therapy and aggressive risk factor modification for secondary prevention of coronary artery disease.     Electronically Signed   By: Thomasene Ripple D.O.   On:  06/02/2022 08:26   Assessment & Plan   1.  Coronary artery disease-denies chest pain.  Underwent coronary CTA 05/27/2022 which showed a coronary calcium  score of 407, left main 0, LAD 377, circumflex 0, RCA 29.  CAD RADS 3 moderate stenosis.  FFR showed left main 0.98, LAD proximal 0.96, mid 0.91, distal 0.80, circumflex proximal 0.98, distal 0.96.  Indicating no significant stenosis.  Medical management was recommended. Continue Vascepa, co-Q10, atorvastatin  Hyperlipidemia-LDL 54 on 11/23. High-fiber diet Maintain physical activity Continue current medical therapy Follows with PCP  Essential hypertension-BP today 130/62 Maintain blood pressure log Low sodium diet  DM-glucose 232 on 05/23/2022 Continue metformin, Actos Follows with PCP  Disposition: Follow-up with Dr. Allyson Sabal or me in 9-12 months.   Thomasene Ripple. Chastidy Ranker NP-C     10/31/2022, 4:41 PM [facility] [address] Office [phone] Fax [phone]    I spent 14 minutes examining this patient, reviewing medications, and using patient centered shared decision making involving her cardiac care.  Prior to her visit I spent greater than 20 minutes reviewing her past medical history,  medications, and prior cardiac tests.

## 2022-10-31 ENCOUNTER — Encounter: Payer: Self-pay | Admitting: General Practice

## 2022-10-31 ENCOUNTER — Ambulatory Visit: Payer: Medicare PPO | Attending: Cardiovascular Disease | Admitting: General Practice

## 2022-10-31 VITALS — BP 130/62 | HR 83 | Ht 77.0 in | Wt 223.0 lb

## 2022-10-31 DIAGNOSIS — I2510 Atherosclerotic heart disease of native coronary artery without angina pectoris: Secondary | ICD-10-CM | POA: Diagnosis not present

## 2022-10-31 DIAGNOSIS — E785 Hyperlipidemia, unspecified: Secondary | ICD-10-CM

## 2022-10-31 DIAGNOSIS — I10 Essential (primary) hypertension: Secondary | ICD-10-CM | POA: Diagnosis not present

## 2022-10-31 NOTE — Patient Instructions (Signed)
Medication Instructions:  The current medical regimen is effective;  continue present plan and medications as directed. Please refer to the Current Medication list given to you today.  *If you need a refill on your cardiac medications before your next appointment, please call your pharmacy*  Lab Work: NONE If you have labs (blood work) drawn today and your tests are completely normal, you will receive your results only by:  MyChart Message (if you have MyChart) OR   A paper copy in the mail If you have any lab test that is abnormal or we need to change your treatment, we will call you to review the results.  Other Instructions MAY TAKE BENEFIBER OR METAMUCIL INCREASE PHYSICAL ACTIVITY AS TOLERATED  Follow-Up: At [facility], you and your health needs are our priority.  As part of our continuing mission to provide you with exceptional heart care, we have created designated Provider Care Teams.  These Care Teams include your primary Cardiologist (physician) and Advanced Practice Providers (APPs -  Physician Assistants and Nurse Practitioners) who all work together to provide you with the care you need, when you need it.  Your next appointment:   9-12 month(s)  Provider:   Nanetta Batty, MD  or Edd Fabian, FNP

## 2022-11-10 DIAGNOSIS — E1165 Type 2 diabetes mellitus with hyperglycemia: Secondary | ICD-10-CM | POA: Diagnosis not present

## 2022-11-11 DIAGNOSIS — E1165 Type 2 diabetes mellitus with hyperglycemia: Secondary | ICD-10-CM | POA: Diagnosis not present

## 2022-11-11 DIAGNOSIS — E785 Hyperlipidemia, unspecified: Secondary | ICD-10-CM | POA: Diagnosis not present

## 2022-11-11 DIAGNOSIS — E559 Vitamin D deficiency, unspecified: Secondary | ICD-10-CM | POA: Diagnosis not present

## 2022-11-11 DIAGNOSIS — N182 Chronic kidney disease, stage 2 (mild): Secondary | ICD-10-CM | POA: Diagnosis not present

## 2022-11-14 DIAGNOSIS — E1165 Type 2 diabetes mellitus with hyperglycemia: Secondary | ICD-10-CM | POA: Diagnosis not present

## 2022-11-14 DIAGNOSIS — Z9641 Presence of insulin pump (external) (internal): Secondary | ICD-10-CM | POA: Diagnosis not present

## 2022-11-14 DIAGNOSIS — E1122 Type 2 diabetes mellitus with diabetic chronic kidney disease: Secondary | ICD-10-CM | POA: Diagnosis not present

## 2022-11-14 DIAGNOSIS — E11649 Type 2 diabetes mellitus with hypoglycemia without coma: Secondary | ICD-10-CM | POA: Diagnosis not present

## 2022-11-14 DIAGNOSIS — Z978 Presence of other specified devices: Secondary | ICD-10-CM | POA: Diagnosis not present

## 2022-11-14 DIAGNOSIS — E1142 Type 2 diabetes mellitus with diabetic polyneuropathy: Secondary | ICD-10-CM | POA: Diagnosis not present

## 2022-11-14 DIAGNOSIS — E1169 Type 2 diabetes mellitus with other specified complication: Secondary | ICD-10-CM | POA: Diagnosis not present

## 2022-11-14 DIAGNOSIS — E113293 Type 2 diabetes mellitus with mild nonproliferative diabetic retinopathy without macular edema, bilateral: Secondary | ICD-10-CM | POA: Diagnosis not present

## 2022-11-14 DIAGNOSIS — Z794 Long term (current) use of insulin: Secondary | ICD-10-CM | POA: Diagnosis not present

## 2022-12-11 DIAGNOSIS — E1165 Type 2 diabetes mellitus with hyperglycemia: Secondary | ICD-10-CM | POA: Diagnosis not present

## 2023-01-26 DIAGNOSIS — J439 Emphysema, unspecified: Secondary | ICD-10-CM | POA: Diagnosis not present

## 2023-01-26 DIAGNOSIS — N401 Enlarged prostate with lower urinary tract symptoms: Secondary | ICD-10-CM | POA: Diagnosis not present

## 2023-01-26 DIAGNOSIS — E113293 Type 2 diabetes mellitus with mild nonproliferative diabetic retinopathy without macular edema, bilateral: Secondary | ICD-10-CM | POA: Diagnosis not present

## 2023-01-26 DIAGNOSIS — I700 Atherosclerosis of aorta: Secondary | ICD-10-CM | POA: Diagnosis not present

## 2023-01-26 DIAGNOSIS — Z1331 Encounter for screening for depression: Secondary | ICD-10-CM | POA: Diagnosis not present

## 2023-01-26 DIAGNOSIS — Z23 Encounter for immunization: Secondary | ICD-10-CM | POA: Diagnosis not present

## 2023-01-26 DIAGNOSIS — Z9181 History of falling: Secondary | ICD-10-CM | POA: Diagnosis not present

## 2023-01-26 DIAGNOSIS — N138 Other obstructive and reflux uropathy: Secondary | ICD-10-CM | POA: Diagnosis not present

## 2023-01-26 DIAGNOSIS — Z0000 Encounter for general adult medical examination without abnormal findings: Secondary | ICD-10-CM | POA: Diagnosis not present

## 2023-01-26 DIAGNOSIS — Z794 Long term (current) use of insulin: Secondary | ICD-10-CM | POA: Diagnosis not present

## 2023-01-26 DIAGNOSIS — E1142 Type 2 diabetes mellitus with diabetic polyneuropathy: Secondary | ICD-10-CM | POA: Diagnosis not present

## 2023-01-26 DIAGNOSIS — E7800 Pure hypercholesterolemia, unspecified: Secondary | ICD-10-CM | POA: Diagnosis not present

## 2023-01-30 DIAGNOSIS — Z86006 Personal history of melanoma in-situ: Secondary | ICD-10-CM | POA: Diagnosis not present

## 2023-01-30 DIAGNOSIS — D485 Neoplasm of uncertain behavior of skin: Secondary | ICD-10-CM | POA: Diagnosis not present

## 2023-01-30 DIAGNOSIS — Z85828 Personal history of other malignant neoplasm of skin: Secondary | ICD-10-CM | POA: Diagnosis not present

## 2023-01-30 DIAGNOSIS — D225 Melanocytic nevi of trunk: Secondary | ICD-10-CM | POA: Diagnosis not present

## 2023-01-30 DIAGNOSIS — L570 Actinic keratosis: Secondary | ICD-10-CM | POA: Diagnosis not present

## 2023-01-30 DIAGNOSIS — C44712 Basal cell carcinoma of skin of right lower limb, including hip: Secondary | ICD-10-CM | POA: Diagnosis not present

## 2023-01-30 DIAGNOSIS — C44719 Basal cell carcinoma of skin of left lower limb, including hip: Secondary | ICD-10-CM | POA: Diagnosis not present

## 2023-01-30 DIAGNOSIS — D2372 Other benign neoplasm of skin of left lower limb, including hip: Secondary | ICD-10-CM | POA: Diagnosis not present

## 2023-01-30 DIAGNOSIS — L821 Other seborrheic keratosis: Secondary | ICD-10-CM | POA: Diagnosis not present

## 2023-01-30 DIAGNOSIS — L578 Other skin changes due to chronic exposure to nonionizing radiation: Secondary | ICD-10-CM | POA: Diagnosis not present

## 2023-02-08 DIAGNOSIS — E1165 Type 2 diabetes mellitus with hyperglycemia: Secondary | ICD-10-CM | POA: Diagnosis not present

## 2023-03-02 DIAGNOSIS — E1165 Type 2 diabetes mellitus with hyperglycemia: Secondary | ICD-10-CM | POA: Diagnosis not present

## 2023-03-02 DIAGNOSIS — E11649 Type 2 diabetes mellitus with hypoglycemia without coma: Secondary | ICD-10-CM | POA: Diagnosis not present

## 2023-03-05 DIAGNOSIS — R7989 Other specified abnormal findings of blood chemistry: Secondary | ICD-10-CM | POA: Diagnosis not present

## 2023-03-05 DIAGNOSIS — E11649 Type 2 diabetes mellitus with hypoglycemia without coma: Secondary | ICD-10-CM | POA: Diagnosis not present

## 2023-03-05 DIAGNOSIS — E1122 Type 2 diabetes mellitus with diabetic chronic kidney disease: Secondary | ICD-10-CM | POA: Diagnosis not present

## 2023-03-05 DIAGNOSIS — E1165 Type 2 diabetes mellitus with hyperglycemia: Secondary | ICD-10-CM | POA: Diagnosis not present

## 2023-03-05 DIAGNOSIS — E113293 Type 2 diabetes mellitus with mild nonproliferative diabetic retinopathy without macular edema, bilateral: Secondary | ICD-10-CM | POA: Diagnosis not present

## 2023-03-05 DIAGNOSIS — E1169 Type 2 diabetes mellitus with other specified complication: Secondary | ICD-10-CM | POA: Diagnosis not present

## 2023-03-05 DIAGNOSIS — Z794 Long term (current) use of insulin: Secondary | ICD-10-CM | POA: Diagnosis not present

## 2023-03-05 DIAGNOSIS — I129 Hypertensive chronic kidney disease with stage 1 through stage 4 chronic kidney disease, or unspecified chronic kidney disease: Secondary | ICD-10-CM | POA: Diagnosis not present

## 2023-03-05 DIAGNOSIS — E1142 Type 2 diabetes mellitus with diabetic polyneuropathy: Secondary | ICD-10-CM | POA: Diagnosis not present

## 2023-03-11 DIAGNOSIS — Z794 Long term (current) use of insulin: Secondary | ICD-10-CM | POA: Diagnosis not present

## 2023-03-11 DIAGNOSIS — E1165 Type 2 diabetes mellitus with hyperglycemia: Secondary | ICD-10-CM | POA: Diagnosis not present

## 2023-03-18 DIAGNOSIS — C44719 Basal cell carcinoma of skin of left lower limb, including hip: Secondary | ICD-10-CM | POA: Diagnosis not present

## 2023-03-20 DIAGNOSIS — H2513 Age-related nuclear cataract, bilateral: Secondary | ICD-10-CM | POA: Diagnosis not present

## 2023-03-20 DIAGNOSIS — H35372 Puckering of macula, left eye: Secondary | ICD-10-CM | POA: Diagnosis not present

## 2023-03-20 DIAGNOSIS — E113293 Type 2 diabetes mellitus with mild nonproliferative diabetic retinopathy without macular edema, bilateral: Secondary | ICD-10-CM | POA: Diagnosis not present

## 2023-03-23 ENCOUNTER — Other Ambulatory Visit (HOSPITAL_COMMUNITY): Payer: Self-pay | Admitting: Cardiovascular Disease

## 2023-03-23 DIAGNOSIS — R079 Chest pain, unspecified: Secondary | ICD-10-CM

## 2023-05-09 DIAGNOSIS — E1165 Type 2 diabetes mellitus with hyperglycemia: Secondary | ICD-10-CM | POA: Diagnosis not present

## 2023-05-12 DIAGNOSIS — C44712 Basal cell carcinoma of skin of right lower limb, including hip: Secondary | ICD-10-CM | POA: Diagnosis not present

## 2023-06-02 DIAGNOSIS — I129 Hypertensive chronic kidney disease with stage 1 through stage 4 chronic kidney disease, or unspecified chronic kidney disease: Secondary | ICD-10-CM | POA: Diagnosis not present

## 2023-06-02 DIAGNOSIS — E785 Hyperlipidemia, unspecified: Secondary | ICD-10-CM | POA: Diagnosis not present

## 2023-06-02 DIAGNOSIS — N182 Chronic kidney disease, stage 2 (mild): Secondary | ICD-10-CM | POA: Diagnosis not present

## 2023-06-02 DIAGNOSIS — Z794 Long term (current) use of insulin: Secondary | ICD-10-CM | POA: Diagnosis not present

## 2023-06-02 DIAGNOSIS — E1122 Type 2 diabetes mellitus with diabetic chronic kidney disease: Secondary | ICD-10-CM | POA: Diagnosis not present

## 2023-06-05 DIAGNOSIS — R7401 Elevation of levels of liver transaminase levels: Secondary | ICD-10-CM | POA: Diagnosis not present

## 2023-06-08 DIAGNOSIS — Z794 Long term (current) use of insulin: Secondary | ICD-10-CM | POA: Diagnosis not present

## 2023-06-08 DIAGNOSIS — E1165 Type 2 diabetes mellitus with hyperglycemia: Secondary | ICD-10-CM | POA: Diagnosis not present

## 2023-07-03 DIAGNOSIS — E1165 Type 2 diabetes mellitus with hyperglycemia: Secondary | ICD-10-CM | POA: Diagnosis not present

## 2023-07-03 DIAGNOSIS — E11649 Type 2 diabetes mellitus with hypoglycemia without coma: Secondary | ICD-10-CM | POA: Diagnosis not present

## 2023-07-06 DIAGNOSIS — M25562 Pain in left knee: Secondary | ICD-10-CM | POA: Diagnosis not present

## 2023-07-06 DIAGNOSIS — M25511 Pain in right shoulder: Secondary | ICD-10-CM | POA: Diagnosis not present

## 2023-07-07 DIAGNOSIS — M25562 Pain in left knee: Secondary | ICD-10-CM | POA: Diagnosis not present

## 2023-07-15 DIAGNOSIS — H5203 Hypermetropia, bilateral: Secondary | ICD-10-CM | POA: Diagnosis not present

## 2023-07-15 DIAGNOSIS — H52223 Regular astigmatism, bilateral: Secondary | ICD-10-CM | POA: Diagnosis not present

## 2023-08-04 ENCOUNTER — Encounter: Payer: Self-pay | Admitting: Cardiovascular Disease

## 2023-08-04 ENCOUNTER — Ambulatory Visit: Payer: Medicare PPO | Attending: Cardiovascular Disease | Admitting: Cardiovascular Disease

## 2023-08-04 VITALS — BP 132/76 | HR 72 | Ht 77.0 in | Wt 234.0 lb

## 2023-08-04 DIAGNOSIS — I10 Essential (primary) hypertension: Secondary | ICD-10-CM

## 2023-08-04 DIAGNOSIS — R0789 Other chest pain: Secondary | ICD-10-CM | POA: Diagnosis not present

## 2023-08-04 DIAGNOSIS — M722 Plantar fascial fibromatosis: Secondary | ICD-10-CM | POA: Diagnosis not present

## 2023-08-04 DIAGNOSIS — E11621 Type 2 diabetes mellitus with foot ulcer: Secondary | ICD-10-CM | POA: Diagnosis not present

## 2023-08-04 NOTE — Assessment & Plan Note (Signed)
 History of atypical chest pain in the past with a coronary CTA performed 05/27/2022 revealing a coronary calcium score of 407 the majority of which was in the LAD that was FFR negative.  He has had no recurrent symptoms.  He is at goal for secondary prevention.

## 2023-08-04 NOTE — Progress Notes (Signed)
 08/04/2023 Corey Nielsen   [birth year]  [id number]  Primary Physician Thana Ates, MD Primary Cardiologist: Runell Gess MD Nicholes Calamity, [place]  HPI:  Corey Nielsen is a 78 y.o.  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 He was referred by his PCP because of a episode of atypical chest pain.  I last saw him in the office 03/19/2022.  His risk factors include remote tobacco abuse having quit in 1988. He has treated hyperlipidemia and diabetes. There is no family history for heart disease. Is never had heart attack or stroke. He had a prolonged episode of chest pain last Friday night lasting 5 to 6 hours which resolved spontaneously. There were no other associated symptoms. EMS was called but his pain resolved by the time he arrived. He had no recurrence.  Since I saw him a year and a half ago he has remained stable.  He denies chest pain or shortness of breath.  I did do a coronary CTA on him 05/27/2022 revealing a coronary calcium score of 470 majority which was in the LAD which was FFR negative for significance.  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   Current Meds  Medication Sig   atorvastatin (LIPITOR) 20 MG tablet TAKE 1 TABLET EACH DAY   Coenzyme Q10 (CO Q 10 PO) Take 300 mg by mouth daily.   cyanocobalamin 100 MCG tablet daily.   Dutasteride-Tamsulosin HCl (JALYN) 0.5-0.4 MG CAPS TAKE ONE (1) CAPSULE EACHDAY   ibuprofen (ADVIL) 800 MG tablet as needed for headache.   Insulin Human (INSULIN PUMP) 100 unit/ml SOLN Inject into the skin. Insulin pump with novolog insulin   metFORMIN (GLUCOPHAGE-XR) 500 MG 24 hr tablet Take 500 mg by mouth in the morning and at bedtime.   Multiple Vitamins-Minerals (MULTIVITAMIN ADULTS PO) Take 1 tablet by mouth daily.   NOVOLOG 100 UNIT/ML injection daily at 6 (six) AM. Use as directed   ONETOUCH DELICA LANCETS 33G MISC U UTD TO TEST BLOOD  SUGARS 8 TIMES A DAY   ONETOUCH VERIO test strip U TO TEST EIGHT TIMES D UTD   OZEMPIC, 0.25 OR 0.5 MG/DOSE, 2 MG/3ML SOPN Inject into the skin once a week.   pioglitazone (ACTOS) 30 MG tablet Take 1 tablet (30 mg total) by mouth daily.   tamsulosin (FLOMAX) 0.4 MG CAPS capsule Take 0.4 mg by mouth every 12 (twelve) hours.   VASCEPA 1 g capsule TAKE 2 CAPSULES BY MOUTH TWICE DAILY WITH MEALS FOR TRIGLYCERIDES   Current Facility-Administered Medications for the 08/04/23 encounter (Office Visit) with Runell Gess, MD  Medication   triamcinolone acetonide (KENALOG) 10 MG/ML injection 10 mg     Allergies  Allergen Reactions   Tetanus Toxoids Shortness Of Breath    Horse serum   Amoxicillin    Tetanus Immune Globulin Anxiety    Other Reaction(s): Angioedema    Social History   Socioeconomic History   Marital status: Widowed    Spouse name: Not on file   Number of children: Not on file   Years of education: Not on file   Highest education level: Not on file  Occupational History   Not on file  Tobacco Use   Smoking status: Former    Current packs/day: 0.00    Average packs/day: 0.5 packs/day for 25.0 years (12.5 ttl pk-yrs)    Types:  Cigarettes    Start date: 05/05/1961    Quit date: 05/05/1986    Years since quitting: 37.2   Smokeless tobacco: Never  Substance and Sexual Activity   Alcohol use: Yes    Comment: rare   Drug use: No   Sexual activity: Not on file  Other Topics Concern   Not on file  Social History Narrative   Not on file   Social Drivers of Health   Financial Resource Strain: Not on file  Food Insecurity: Not on file  Transportation Needs: Not on file  Physical Activity: Not on file  Stress: Not on file  Social Connections: Not on file  Intimate Partner Violence: Not on file     Review of Systems: General: negative for chills, fever, night sweats or weight changes.  Cardiovascular: negative for chest pain, dyspnea on exertion, edema, orthopnea,  palpitations, paroxysmal nocturnal dyspnea or shortness of breath Dermatological: negative for rash Respiratory: negative for cough or wheezing Urologic: negative for hematuria Abdominal: negative for nausea, vomiting, diarrhea, bright red blood per rectum, melena, or hematemesis Neurologic: negative for visual changes, syncope, or dizziness All other systems reviewed and are otherwise negative except as noted above.    Blood pressure 132/76, pulse 72, height 6\' 5"  (1.956 m), weight 234 lb (106.1 kg), SpO2 97%.    EKG EKG Interpretation Date/Time:  Tuesday August 04 2023 07:46:54 EDT Ventricular Rate:  72 PR Interval:  176 QRS Duration:  102 QT Interval:  412 QTC Calculation: 451 R Axis:   109  Text Interpretation: Sinus rhythm with Fusion complexes and Premature atrial complexes with Abberant conduction Rightward axis Incomplete right bundle branch block When compared with ECG of 19-Oct-1998 08:05, Fusion complexes are now Present Abberant conduction is now Present Confirmed by Nanetta Batty [phone]) on 08/04/2023 8:12:18 AM    ASSESSMENT AND PLAN:   HYPERLIPIDEMIA History of hyperlipidemia on statin therapy with lipid profile performed 2 months ago revealing total cholesterol 108, LDL 44 and HDL of 48.  Atypical chest pain History of atypical chest pain in the past with a coronary CTA performed 05/27/2022 revealing a coronary calcium score of 407 the majority of which was in the LAD that was FFR negative.  He has had no recurrent symptoms.  He is at goal for secondary prevention.     Runell Gess MD FACP,FACC,FAHA, [facility] 08/04/2023 8:23 AM

## 2023-08-04 NOTE — Assessment & Plan Note (Signed)
 History of hyperlipidemia on statin therapy with lipid profile performed 2 months ago revealing total cholesterol 108, LDL 44 and HDL of 48.

## 2023-08-04 NOTE — Patient Instructions (Signed)
 Medication Instructions:  Your physician recommends that you continue on your current medications as directed. Please refer to the Current Medication list given to you today.  *If you need a refill on your cardiac medications before your next appointment, please call your pharmacy*   Follow-Up: At [facility], you and your health needs are our priority.  As part of our continuing mission to provide you with exceptional heart care, our providers are all part of one team.  This team includes your primary Cardiologist (physician) and Advanced Practice Providers or APPs (Physician Assistants and Nurse Practitioners) who all work together to provide you with the care you need, when you need it.  Your next appointment:   12 month(s)  Provider:   Nanetta Batty, MD     We recommend signing up for the patient portal called "MyChart".  Sign up information is provided on this After Visit Summary.  MyChart is used to connect with patients for Virtual Visits (Telemedicine).  Patients are able to view lab/test results, encounter notes, upcoming appointments, etc.  Non-urgent messages can be sent to your provider as well.   To learn more about what you can do with MyChart, go to [email or web address].   Other Instructions       1st Floor: - Lobby - Registration  - Pharmacy  - Lab - Cafe  2nd Floor: - PV Lab - Diagnostic Testing (echo, CT, nuclear med)  3rd Floor: - Vacant  4th Floor: - TCTS (cardiothoracic surgery) - AFib Clinic - Structural Heart Clinic - Vascular Surgery  - Vascular Ultrasound  5th Floor: - HeartCare Cardiology (general and EP) - Clinical Pharmacy for coumadin, hypertension, lipid, weight-loss medications, and med management appointments    Valet parking services will be available as well.

## 2023-08-05 DIAGNOSIS — D485 Neoplasm of uncertain behavior of skin: Secondary | ICD-10-CM | POA: Diagnosis not present

## 2023-08-05 DIAGNOSIS — D225 Melanocytic nevi of trunk: Secondary | ICD-10-CM | POA: Diagnosis not present

## 2023-08-05 DIAGNOSIS — L578 Other skin changes due to chronic exposure to nonionizing radiation: Secondary | ICD-10-CM | POA: Diagnosis not present

## 2023-08-05 DIAGNOSIS — Z85828 Personal history of other malignant neoplasm of skin: Secondary | ICD-10-CM | POA: Diagnosis not present

## 2023-08-05 DIAGNOSIS — L821 Other seborrheic keratosis: Secondary | ICD-10-CM | POA: Diagnosis not present

## 2023-08-05 DIAGNOSIS — E1169 Type 2 diabetes mellitus with other specified complication: Secondary | ICD-10-CM | POA: Diagnosis not present

## 2023-08-05 DIAGNOSIS — D2372 Other benign neoplasm of skin of left lower limb, including hip: Secondary | ICD-10-CM | POA: Diagnosis not present

## 2023-08-05 DIAGNOSIS — Z86006 Personal history of melanoma in-situ: Secondary | ICD-10-CM | POA: Diagnosis not present

## 2023-08-05 DIAGNOSIS — L570 Actinic keratosis: Secondary | ICD-10-CM | POA: Diagnosis not present

## 2023-08-05 DIAGNOSIS — D0472 Carcinoma in situ of skin of left lower limb, including hip: Secondary | ICD-10-CM | POA: Diagnosis not present

## 2023-08-07 DIAGNOSIS — E1165 Type 2 diabetes mellitus with hyperglycemia: Secondary | ICD-10-CM | POA: Diagnosis not present

## 2023-08-10 DIAGNOSIS — Z794 Long term (current) use of insulin: Secondary | ICD-10-CM | POA: Diagnosis not present

## 2023-08-10 DIAGNOSIS — M21969 Unspecified acquired deformity of unspecified lower leg: Secondary | ICD-10-CM | POA: Diagnosis not present

## 2023-08-10 DIAGNOSIS — E113299 Type 2 diabetes mellitus with mild nonproliferative diabetic retinopathy without macular edema, unspecified eye: Secondary | ICD-10-CM | POA: Diagnosis not present

## 2023-08-10 DIAGNOSIS — E1165 Type 2 diabetes mellitus with hyperglycemia: Secondary | ICD-10-CM | POA: Diagnosis not present

## 2023-08-17 DIAGNOSIS — E1165 Type 2 diabetes mellitus with hyperglycemia: Secondary | ICD-10-CM | POA: Diagnosis not present

## 2023-09-04 DIAGNOSIS — R972 Elevated prostate specific antigen [PSA]: Secondary | ICD-10-CM | POA: Diagnosis not present

## 2023-09-16 DIAGNOSIS — E1165 Type 2 diabetes mellitus with hyperglycemia: Secondary | ICD-10-CM | POA: Diagnosis not present

## 2023-09-18 DIAGNOSIS — N401 Enlarged prostate with lower urinary tract symptoms: Secondary | ICD-10-CM | POA: Diagnosis not present

## 2023-09-18 DIAGNOSIS — R972 Elevated prostate specific antigen [PSA]: Secondary | ICD-10-CM | POA: Diagnosis not present

## 2023-09-18 DIAGNOSIS — R3915 Urgency of urination: Secondary | ICD-10-CM | POA: Diagnosis not present

## 2023-09-21 DIAGNOSIS — E11649 Type 2 diabetes mellitus with hypoglycemia without coma: Secondary | ICD-10-CM | POA: Diagnosis not present

## 2023-09-21 DIAGNOSIS — E1165 Type 2 diabetes mellitus with hyperglycemia: Secondary | ICD-10-CM | POA: Diagnosis not present

## 2023-09-22 DIAGNOSIS — D0472 Carcinoma in situ of skin of left lower limb, including hip: Secondary | ICD-10-CM | POA: Diagnosis not present

## 2023-09-24 DIAGNOSIS — M2141 Flat foot [pes planus] (acquired), right foot: Secondary | ICD-10-CM | POA: Diagnosis not present

## 2023-09-24 DIAGNOSIS — M2142 Flat foot [pes planus] (acquired), left foot: Secondary | ICD-10-CM | POA: Diagnosis not present

## 2023-09-24 DIAGNOSIS — Z8631 Personal history of diabetic foot ulcer: Secondary | ICD-10-CM | POA: Diagnosis not present

## 2023-09-24 DIAGNOSIS — E1165 Type 2 diabetes mellitus with hyperglycemia: Secondary | ICD-10-CM | POA: Diagnosis not present

## 2023-10-17 DIAGNOSIS — E1165 Type 2 diabetes mellitus with hyperglycemia: Secondary | ICD-10-CM | POA: Diagnosis not present

## 2023-11-02 DIAGNOSIS — H53483 Generalized contraction of visual field, bilateral: Secondary | ICD-10-CM | POA: Diagnosis not present

## 2023-11-02 DIAGNOSIS — R519 Headache, unspecified: Secondary | ICD-10-CM | POA: Diagnosis not present

## 2023-11-02 DIAGNOSIS — H43812 Vitreous degeneration, left eye: Secondary | ICD-10-CM | POA: Diagnosis not present

## 2023-11-02 DIAGNOSIS — H35372 Puckering of macula, left eye: Secondary | ICD-10-CM | POA: Diagnosis not present

## 2023-11-02 DIAGNOSIS — H538 Other visual disturbances: Secondary | ICD-10-CM | POA: Diagnosis not present

## 2023-11-02 DIAGNOSIS — E1136 Type 2 diabetes mellitus with diabetic cataract: Secondary | ICD-10-CM | POA: Diagnosis not present

## 2023-11-02 DIAGNOSIS — Z5321 Procedure and treatment not carried out due to patient leaving prior to being seen by health care provider: Secondary | ICD-10-CM | POA: Diagnosis not present

## 2023-11-02 DIAGNOSIS — E113293 Type 2 diabetes mellitus with mild nonproliferative diabetic retinopathy without macular edema, bilateral: Secondary | ICD-10-CM | POA: Diagnosis not present

## 2023-11-02 NOTE — ED Notes (Signed)
 Patient wanted to leave and their ID bracelet was cut.   Josefa POUR [place], [place] 11/02/23 [phone]

## 2023-11-02 NOTE — ED Triage Notes (Signed)
 Pt to ED POV sent by ophthalmologist d/t headache and L eye blurry vision. Pt reports being sent here for a head CT scan. GCS 15. VSS. Ambulatory to triage.

## 2023-11-03 ENCOUNTER — Other Ambulatory Visit: Payer: Self-pay

## 2023-11-03 ENCOUNTER — Encounter (HOSPITAL_COMMUNITY): Payer: Self-pay | Admitting: Internal Medicine

## 2023-11-03 ENCOUNTER — Observation Stay (HOSPITAL_BASED_OUTPATIENT_CLINIC_OR_DEPARTMENT_OTHER)
Admission: EM | Admit: 2023-11-03 | Discharge: 2023-11-05 | Disposition: A | Discharge status: Home / Self Care | Source: Ambulatory Visit | Attending: Internal Medicine | Admitting: Internal Medicine

## 2023-11-03 ENCOUNTER — Emergency Department (HOSPITAL_COMMUNITY)

## 2023-11-03 ENCOUNTER — Emergency Department (HOSPITAL_BASED_OUTPATIENT_CLINIC_OR_DEPARTMENT_OTHER)

## 2023-11-03 DIAGNOSIS — I10 Essential (primary) hypertension: Secondary | ICD-10-CM | POA: Insufficient documentation

## 2023-11-03 DIAGNOSIS — F10920 Alcohol use, unspecified with intoxication, uncomplicated: Secondary | ICD-10-CM | POA: Insufficient documentation

## 2023-11-03 DIAGNOSIS — Z7982 Long term (current) use of aspirin: Secondary | ICD-10-CM | POA: Insufficient documentation

## 2023-11-03 DIAGNOSIS — J320 Chronic maxillary sinusitis: Secondary | ICD-10-CM | POA: Diagnosis not present

## 2023-11-03 DIAGNOSIS — I639 Cerebral infarction, unspecified: Secondary | ICD-10-CM

## 2023-11-03 DIAGNOSIS — N400 Enlarged prostate without lower urinary tract symptoms: Secondary | ICD-10-CM | POA: Diagnosis not present

## 2023-11-03 DIAGNOSIS — I6340 Cerebral infarction due to embolism of unspecified cerebral artery: Secondary | ICD-10-CM | POA: Diagnosis not present

## 2023-11-03 DIAGNOSIS — R29818 Other symptoms and signs involving the nervous system: Secondary | ICD-10-CM | POA: Diagnosis not present

## 2023-11-03 DIAGNOSIS — E785 Hyperlipidemia, unspecified: Secondary | ICD-10-CM | POA: Insufficient documentation

## 2023-11-03 DIAGNOSIS — I6521 Occlusion and stenosis of right carotid artery: Secondary | ICD-10-CM | POA: Diagnosis not present

## 2023-11-03 DIAGNOSIS — R2689 Other abnormalities of gait and mobility: Secondary | ICD-10-CM | POA: Insufficient documentation

## 2023-11-03 DIAGNOSIS — H5340 Unspecified visual field defects: Secondary | ICD-10-CM | POA: Diagnosis not present

## 2023-11-03 DIAGNOSIS — E109 Type 1 diabetes mellitus without complications: Secondary | ICD-10-CM

## 2023-11-03 DIAGNOSIS — I6782 Cerebral ischemia: Secondary | ICD-10-CM | POA: Diagnosis not present

## 2023-11-03 DIAGNOSIS — I63531 Cerebral infarction due to unspecified occlusion or stenosis of right posterior cerebral artery: Secondary | ICD-10-CM | POA: Diagnosis not present

## 2023-11-03 DIAGNOSIS — R9082 White matter disease, unspecified: Secondary | ICD-10-CM | POA: Diagnosis not present

## 2023-11-03 DIAGNOSIS — Z794 Long term (current) use of insulin: Secondary | ICD-10-CM | POA: Diagnosis not present

## 2023-11-03 DIAGNOSIS — E119 Type 2 diabetes mellitus without complications: Secondary | ICD-10-CM | POA: Insufficient documentation

## 2023-11-03 DIAGNOSIS — R519 Headache, unspecified: Secondary | ICD-10-CM | POA: Diagnosis not present

## 2023-11-03 LAB — DIFFERENTIAL
Abs Immature Granulocytes: 0.02 10*3/uL (ref 0.00–0.07)
Basophils Absolute: 0 10*3/uL (ref 0.0–0.1)
Basophils Relative: 1 %
Eosinophils Absolute: 0.1 10*3/uL (ref 0.0–0.5)
Eosinophils Relative: 2 %
Immature Granulocytes: 0 %
Lymphocytes Relative: 29 %
Lymphs Abs: 2 10*3/uL (ref 0.7–4.0)
Monocytes Absolute: 0.6 10*3/uL (ref 0.1–1.0)
Monocytes Relative: 8 %
Neutro Abs: 4.2 10*3/uL (ref 1.7–7.7)
Neutrophils Relative %: 60 %

## 2023-11-03 LAB — COMPREHENSIVE METABOLIC PANEL WITH GFR
ALT: 15 U/L (ref 0–44)
AST: 20 U/L (ref 15–41)
Albumin: 3.8 g/dL (ref 3.5–5.0)
Alkaline Phosphatase: 76 U/L (ref 38–126)
Anion gap: 9 (ref 5–15)
BUN: 26 mg/dL — ABNORMAL HIGH (ref 8–23)
CO2: 24 mmol/L (ref 22–32)
Calcium: 9.2 mg/dL (ref 8.9–10.3)
Chloride: 105 mmol/L (ref 98–111)
Creatinine, Ser: 1.01 mg/dL (ref 0.61–1.24)
GFR, Estimated: >60 mL/min (ref >60)
Glucose, Bld: 165 mg/dL — ABNORMAL HIGH (ref 70–99)
Potassium: 4.1 mmol/L (ref 3.5–5.1)
Sodium: 138 mmol/L (ref 135–145)
Total Bilirubin: 0.9 mg/dL (ref 0.0–1.2)
Total Protein: 6.2 g/dL — ABNORMAL LOW (ref 6.5–8.1)

## 2023-11-03 LAB — CBC
HCT: 39.4 % (ref 39.0–52.0)
Hemoglobin: 13.6 g/dL (ref 13.0–17.0)
MCH: 32 pg (ref 26.0–34.0)
MCHC: 34.5 g/dL (ref 30.0–36.0)
MCV: 92.7 fL (ref 80.0–100.0)
Platelets: 212 10*3/uL (ref 150–400)
RBC: 4.25 MIL/uL (ref 4.22–5.81)
RDW: 13.8 % (ref 11.5–15.5)
WBC: 7 10*3/uL (ref 4.0–10.5)
nRBC: 0 % (ref 0.0–0.2)

## 2023-11-03 LAB — CBG MONITORING, ED
Glucose-Capillary: 165 mg/dL — ABNORMAL HIGH (ref 70–99)
Glucose-Capillary: 232 mg/dL — ABNORMAL HIGH (ref 70–99)

## 2023-11-03 LAB — HEMOGLOBIN A1C
Hgb A1c MFr Bld: 7 % — ABNORMAL HIGH (ref 4.8–5.6)
Mean Plasma Glucose: 154.2 mg/dL

## 2023-11-03 LAB — APTT: aPTT: 28 s (ref 24–36)

## 2023-11-03 LAB — PROTIME-INR
INR: 1 (ref 0.8–1.2)
Prothrombin Time: 13.4 s (ref 11.4–15.2)

## 2023-11-03 MED ORDER — HEPARIN SODIUM (PORCINE) 5000 UNIT/ML IJ SOLN
5000.0000 [IU] | Freq: Three times a day (TID) | INTRAMUSCULAR | Status: DC
  Administered 2023-11-03 – 2023-11-05 (×7): 5000 [IU] via SUBCUTANEOUS
  Filled 2023-11-03 (×7): qty 1

## 2023-11-03 MED ORDER — IOHEXOL 350 MG/ML SOLN
75.0000 mL | Freq: Once | INTRAVENOUS | Status: AC | PRN
  Administered 2023-11-03: 75 mL via INTRAVENOUS

## 2023-11-03 MED ORDER — ATORVASTATIN CALCIUM 80 MG PO TABS
80.0000 mg | ORAL_TABLET | Freq: Every day | ORAL | Status: DC
  Administered 2023-11-04 – 2023-11-05 (×2): 80 mg via ORAL
  Filled 2023-11-03 (×2): qty 1

## 2023-11-03 MED ORDER — INSULIN PUMP
Freq: Three times a day (TID) | SUBCUTANEOUS | Status: DC
  Filled 2023-11-03: qty 1

## 2023-11-03 MED ORDER — DUTASTERIDE 0.5 MG PO CAPS
0.5000 mg | ORAL_CAPSULE | Freq: Every day | ORAL | Status: DC
  Administered 2023-11-04 – 2023-11-05 (×2): 0.5 mg via ORAL
  Filled 2023-11-03 (×2): qty 1

## 2023-11-03 MED ORDER — SODIUM CHLORIDE 0.9% FLUSH
3.0000 mL | Freq: Two times a day (BID) | INTRAVENOUS | Status: DC
  Administered 2023-11-03: 10 mL via INTRAVENOUS
  Administered 2023-11-04: 3 mL via INTRAVENOUS

## 2023-11-03 MED ORDER — STROKE: EARLY STAGES OF RECOVERY BOOK
Freq: Once | Status: AC
  Filled 2023-11-03: qty 1

## 2023-11-03 MED ORDER — ASPIRIN 81 MG PO CHEW
324.0000 mg | CHEWABLE_TABLET | Freq: Once | ORAL | Status: AC
  Administered 2023-11-03: 324 mg via ORAL
  Filled 2023-11-03: qty 4

## 2023-11-03 MED ORDER — ACETAMINOPHEN 325 MG PO TABS: 650.0000 mg | ORAL_TABLET | ORAL | Status: DC | PRN

## 2023-11-03 MED ORDER — TAMSULOSIN HCL 0.4 MG PO CAPS
0.4000 mg | ORAL_CAPSULE | Freq: Every day | ORAL | Status: DC
  Administered 2023-11-04 – 2023-11-05 (×2): 0.4 mg via ORAL
  Filled 2023-11-03 (×2): qty 1

## 2023-11-03 MED ORDER — INSULIN ASPART 100 UNIT/ML IJ SOLN
0.0000 [IU] | Freq: Three times a day (TID) | INTRAMUSCULAR | Status: DC
  Administered 2023-11-04: 3 [IU] via SUBCUTANEOUS
  Administered 2023-11-04: 5 [IU] via SUBCUTANEOUS
  Administered 2023-11-04 – 2023-11-05 (×2): 8 [IU] via SUBCUTANEOUS
  Administered 2023-11-05 (×2): 5 [IU] via SUBCUTANEOUS

## 2023-11-03 MED ORDER — DUTASTERIDE-TAMSULOSIN HCL 0.5-0.4 MG PO CAPS: ORAL_CAPSULE | Freq: Every day | ORAL | Status: DC

## 2023-11-03 MED ORDER — ACETAMINOPHEN 650 MG RE SUPP: 650.0000 mg | RECTAL | Status: DC | PRN

## 2023-11-03 MED ORDER — CLOPIDOGREL BISULFATE 75 MG PO TABS
75.0000 mg | ORAL_TABLET | Freq: Every day | ORAL | Status: DC
  Administered 2023-11-03 – 2023-11-05 (×3): 75 mg via ORAL
  Filled 2023-11-03 (×3): qty 1

## 2023-11-03 MED ORDER — SENNOSIDES-DOCUSATE SODIUM 8.6-50 MG PO TABS: 1.0000 | ORAL_TABLET | Freq: Every evening | ORAL | Status: DC | PRN

## 2023-11-03 MED ORDER — ASPIRIN 81 MG PO TBEC
81.0000 mg | DELAYED_RELEASE_TABLET | Freq: Every day | ORAL | Status: DC
  Administered 2023-11-04 – 2023-11-05 (×2): 81 mg via ORAL
  Filled 2023-11-03 (×2): qty 1

## 2023-11-03 MED ORDER — ACETAMINOPHEN 160 MG/5ML PO SOLN: 650.0000 mg | ORAL | Status: DC | PRN

## 2023-11-03 MED ORDER — SODIUM CHLORIDE 0.9% FLUSH: 3.0000 mL | INTRAVENOUS | Status: DC | PRN

## 2023-11-03 MED ORDER — INSULIN ASPART 100 UNIT/ML IJ SOLN
0.0000 [IU] | Freq: Every day | INTRAMUSCULAR | Status: DC
  Administered 2023-11-03 – 2023-11-04 (×2): 2 [IU] via SUBCUTANEOUS

## 2023-11-03 NOTE — ED Notes (Signed)
 Patient going to [facility]

## 2023-11-03 NOTE — ED Notes (Signed)
 Pt sent from [facility] to [facility] for MRI.

## 2023-11-03 NOTE — ED Notes (Signed)
 Pt does have hx migraines. Thursday had a very strong aura, thought it may be his migraine. \Friday ,as he looks to the left with both eyes, he states it is cloudy.

## 2023-11-03 NOTE — ED Provider Notes (Signed)
 Patient sent from [facility] for MRI with negative CT/CTA.  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  Case is discussed with Dr. Michaela.  Recommends admission.  Calls placed to hospital service for admission.  The patient does not have any other focal neurological deficits.  He is given aspirin and is admitted for further evaluation management.  Physical Exam  BP (!) 154/76   Pulse 84   Temp 98.6 F (37 C)   Resp 18   SpO2 97%   Physical Exam Gen: no acute distress Procedures  Procedures  ED Course / MDM   Clinical Course as of 11/03/23 1546  Tue Nov 03, 2023  1117 78 yo male w/ hx of HTN, HLD, diabetes, former smoker (quit in 1980's), here with 4 days of left upper lateral vision loss in both eyes.  Initially had a headache but no longer.  He saw ophthalmologist yesterday and had a normal eye exam but was sent to [facility] ED for stroke workup, had to leave to to 6 hour waiting time, returns today to complete workup.  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  No other CN nerve deficits, no motor deficit or ataxia.  Plan for CTA and likely need for MRI given stroke concern.  Not candidate for tnk, no LVO symptoms [MT]  1416 CTA unremarkable - pt advised for transfer to [facility] ED for MRI brain. I encouraged ambulance transport but patient refused. He understands the risks of traveling unmonitored including worsening symptoms, stroke, injury to himself or others, and death. He understands need to travel directly to [facility] hospital [MT]    Clinical Course User Index [MT] [name], MD   Medical Decision Making Amount and/or Complexity of Data Reviewed Labs: ordered. Radiology: ordered.  Risk OTC drugs. Prescription drug management. Decision regarding hospitalization.          [name], MD 11/03/23 [phone]

## 2023-11-03 NOTE — Discharge Instructions (Signed)

## 2023-11-03 NOTE — ED Triage Notes (Signed)
 C/o headache and visual changes in left eye since Thursday.  Seen ophthalmologist yesterday and were told to go to ED for CT scan. Denies focal deficits.

## 2023-11-03 NOTE — ED Notes (Addendum)
 PT states that he is too tall to get in the bed so he wants to sit in the chair beside the bed instead.PT also wanted  his CBG taken before he ate his dinner. Current CBG is 165.

## 2023-11-03 NOTE — ED Notes (Signed)
 PT in MRI and will taken to room when finished

## 2023-11-03 NOTE — ED Provider Notes (Signed)
 [place] EMERGENCY DEPARTMENT AT [facility] Provider Note   CSN: [id number] Arrival date & time: 11/03/23  9052     Patient presents with: Visual Field Change   Corey Nielsen is a 78 y.o. male.   Patient with history of migraine headaches, high cholesterol, diabetes --presents to the emergency department today for visual field change.  Patient's symptoms started while he was sitting at a desk working, preparing a [employer], 5 days ago.  Patient thought that he was having a migraine, although he has these very infrequently.  He describes a vision change in the left upper lateral visual fields and bilateral eyes.  He did develop a headache in the right temporal area.  For this reason he thought that he was having a migraine.  The following day the headache had improved however the visual field change has been different.  He has had shapes and shimmering in these areas of his vision.  Yesterday he was seen by an ophthalmologist at [facility].  Patient was referred to the emergency department for a CT scan due to the concern for a stroke.  Patient states that he waited in the emergency department for about 6 hours but then could not wait any longer to be seen.  This prompted visit here today. Patient denies signs of stroke including: facial droop, slurred speech, aphasia, weakness/numbness in extremities, imbalance/trouble walking.  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 Authorizing Provider  atorvastatin  (LIPITOR) 20 MG tablet TAKE 1 TABLET EACH DAY 09/30/12   Jenkins, John E, MD  Coenzyme Q10 (CO Q 10 PO) Take 300 mg by mouth daily.    [provider]  cyanocobalamin  100 MCG tablet daily.    [provider]  Dutasteride -Tamsulosin  HCl (JALYN ) 0.5-0.4 MG CAPS  TAKE ONE (1) CAPSULE EACHDAY 09/30/12   Jenkins, John E, MD  ibuprofen (ADVIL) 800 MG tablet as needed for headache. 12/10/18   [provider]  Insulin  Human (INSULIN  PUMP) 100 unit/ml SOLN Inject into the skin. Insulin  pump with novolog  insulin     Jenkins, John E, MD  metFORMIN  (GLUCOPHAGE -XR) 500 MG 24 hr tablet Take 500 mg by mouth in the morning and at bedtime. 08/30/21   [provider]  Multiple Vitamins-Minerals (MULTIVITAMIN ADULTS PO) Take 1 tablet by mouth daily.    [provider]  NOVOLOG  100 UNIT/ML injection daily at 6 (six) AM. Use as directed    [provider]  [facility] DELICA LANCETS 33G MISC U UTD TO TEST BLOOD SUGARS 8 TIMES A DAY 11/01/15   [provider]  ONETOUCH VERIO test strip U TO TEST EIGHT TIMES D UTD 10/13/15   [provider]  OZEMPIC, 0.25 OR 0.5 MG/DOSE, 2 MG/3ML SOPN Inject into the skin once a week.    [provider]  pioglitazone  (ACTOS ) 30 MG tablet Take 1 tablet (30 mg total) by mouth daily. 09/30/12   [name], MD  tamsulosin  (FLOMAX ) 0.4 MG CAPS capsule Take 0.4 mg by mouth every 12 (twelve) hours.    [provider]  VASCEPA 1 g capsule TAKE 2 CAPSULES BY MOUTH TWICE DAILY WITH MEALS FOR TRIGLYCERIDES 08/07/21   [provider]    Allergies: Tetanus toxoids, Amoxicillin, and Tetanus immune globulin    Review of Systems  Updated Vital Signs BP (!) 165/77 (BP Location: Left Arm)   Pulse 75   Temp 97.6 F (36.4 C) (Oral)   Resp 20   SpO2 99%   Physical Exam Vitals and nursing note reviewed.  Constitutional:      Appearance: He is well-developed.  HENT:     Head: Normocephalic and atraumatic.     Right Ear: Tympanic membrane, ear canal and external ear normal.     Left Ear: Tympanic membrane, ear canal and external ear normal.     Nose: Nose normal.     Mouth/Throat:     Pharynx: Uvula midline.   Eyes:     General: Lids are normal. Visual field deficit (left  superior quadrant bilaterally) present.     Conjunctiva/sclera: Conjunctivae normal.     Pupils: Pupils are equal, round, and reactive to light.    Cardiovascular:     Rate and Rhythm: Normal rate and regular rhythm.  Pulmonary:     Effort: Pulmonary effort is normal.     Breath sounds: Normal breath sounds.  Abdominal:     Palpations: Abdomen is soft.     Tenderness: There is no abdominal tenderness.   Musculoskeletal:        General: Normal range of motion.     Cervical back: Normal range of motion and neck supple. No tenderness or bony tenderness.   Skin:    General: Skin is warm and dry.   Neurological:     Mental Status: He is alert and oriented to person, place, and time.     GCS: GCS eye subscore is 4. GCS verbal subscore is 5. GCS motor subscore is 6.     Cranial Nerves: No cranial nerve deficit.     Sensory: No sensory deficit.     Motor: No abnormal muscle tone.     Coordination: Coordination normal.     Gait: Gait normal.     (all labs ordered are listed, but only abnormal results are displayed) Labs Reviewed  COMPREHENSIVE METABOLIC PANEL WITH GFR - Abnormal; Notable for the following components:      Result Value   Glucose, Bld 165 (*)    BUN 26 (*)    Total Protein 6.2 (*)    All other components within normal limits  PROTIME-INR  APTT  CBC  DIFFERENTIAL    EKG: None  Radiology: CT ANGIO HEAD NECK W WO CM Result Date: 11/03/2023 CLINICAL DATA:  Neuro deficit, concern for stroke, bilateral temporal visual field deficit. Headache and vision changes in the left eye. EXAM: CT ANGIOGRAPHY HEAD AND NECK WITH AND WITHOUT CONTRAST TECHNIQUE: Multidetector CT imaging of the head and neck was performed using the standard protocol during bolus administration of intravenous contrast. Multiplanar CT image reconstructions and MIPs were obtained to evaluate the vascular anatomy. Carotid stenosis measurements (when applicable) are obtained utilizing NASCET criteria,  using the distal internal carotid diameter as the denominator. RADIATION DOSE REDUCTION: This exam was performed according to the departmental dose-optimization program which includes automated exposure control, adjustment of the mA and/or kV according to patient size and/or use of iterative reconstruction technique. CONTRAST:  75mL OMNIPAQUE  IOHEXOL  350 MG/ML SOLN COMPARISON:  None Available. FINDINGS: CT HEAD FINDINGS Brain: No acute intracranial hemorrhage. No CT evidence of acute infarct. Nonspecific hypoattenuation in the periventricular and subcortical white matter favored to reflect chronic microvascular ischemic changes. No edema, mass effect, or midline shift. The basilar cisterns are patent. 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 Ventricles: Ventricles are normal in size and configuration. Vascular: No hyperdense vessel. Skull: No acute or aggressive finding. Sinuses/orbits: Orbits are symmetric. Mucosal thickening and mucous retention cysts in the bilateral maxillary sinuses and left sphenoid sinus. Other: Mastoid air cells are clear. CTA NECK FINDINGS Aortic arch: Standard configuration of the aortic arch. Imaged portion shows no evidence of aneurysm or dissection. No significant stenosis of the major arch vessel origins. Pulmonary arteries: As permitted by contrast timing, there are no filling defects in the visualized pulmonary arteries. Subclavian arteries: The subclavian arteries are patent bilaterally. Right carotid system: No evidence of dissection, stenosis (50% or greater), or occlusion. Mild atherosclerosis at the carotid bifurcation. Left carotid system: No evidence of dissection, stenosis (50% or greater), or occlusion. Vertebral arteries: Codominant. No evidence of dissection, stenosis (50% or greater), or occlusion. Skeleton: No acute findings. Degenerative changes in the cervical spine. Disc space narrowing greatest at C5-6 and C6-7. Anterior  endplate osteophytes at multiple levels in the cervical spine. Other neck: The visualized airway is patent. No cervical lymphadenopathy. Upper chest: No acute findings. Review of the MIP images confirms the above findings CTA HEAD FINDINGS ANTERIOR CIRCULATION: The intracranial ICAs are patent bilaterally. Mild atherosclerosis of the right carotid siphon. No significant stenosis, proximal occlusion, aneurysm, or vascular malformation. MCAs: The middle cerebral arteries are patent bilaterally. ACAs: The anterior cerebral arteries are patent bilaterally. POSTERIOR CIRCULATION: No significant stenosis, proximal occlusion, aneurysm, or vascular malformation. PCAs: The posterior cerebral arteries are patent bilaterally. Pcomm: Not well visualized. SCAs: The superior cerebellar arteries are patent bilaterally. Basilar artery: Patent AICAs: Not well visualized. PICAs: Patent Vertebral arteries: The intracranial vertebral arteries are patent. Venous sinuses: As permitted by contrast timing, patent. Anatomic variants: None Review of the MIP images confirms the above findings IMPRESSION: No large vessel occlusion. No high-grade stenosis, aneurysm, or dissection of the arteries in the head and neck. No CT evidence of acute intracranial abnormality. Mild chronic microvascular ischemic changes. Electronically Signed   By: Donnice Mania M.D.   On: 11/03/2023 13:39     Procedures   Medications Ordered in the ED  iohexol  (OMNIPAQUE ) 350 MG/ML injection 75 mL (75 mLs Intravenous Contrast Given 11/03/23 1233)    ED Course  Patient seen and examined. History obtained directly from patient.  Reviewed outpatient ophthalmology note.  Labs/EKG: Ordered stroke panel including PT/INR which is part of this panel.  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 Patient appears stable.  I discussed case with Dr. Matthews, neurology, agrees with MRI.  Ask about admission versus ED transfer for MRI, would like patient be transferred to the ED. if MRI is positive will need admission.  Labs personally reviewed and interpreted including: CBC unremarkable; CMP glucose 165 otherwise unremarkable; PT/INR and APTT ordered as part of stroke order set were normal.  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  Reviewed pertinent lab work and imaging with patient at bedside. Questions answered.   Most current vital signs reviewed and are as follows: BP (!) 146/83  Pulse 72   Temp 97.6 F (36.4 C) (Oral)   Resp 18   SpO2 99%   Plan: Will plan for transfer for MRI.  Dr[facility] Mannie at [facility] accepting.   Discussed transport modality with patient given his current symptoms.  Patient has already driven from [place].  He feels confident that he can drive without his visual field problem being a safety concern.  He states that he is able to use his mirrors safely.  His central visual field is not affected.  I did offer ambulance transport.  We discussed risks of getting into an accident or having progression of possible stroke symptoms.  After discussion, he agrees to these risks.  He states that he is not distressed by the thought of driving currently.  Will secure IV and transfer.  MRI order is in.  Plan to next provider: MRI ordered to determine possibility of stroke as cause of visual symptoms.  If negative, can possibly follow-up as outpatient with neuro/ophthalmology.  If any positive findings, please consult with neurology.  I spoke with Dr. Matthews earlier.    Clinical Course as  of 11/03/23 1418  Tue Nov 03, 2023  1117 78 yo male w/ hx of HTN, HLD, diabetes, former smoker (quit in 1980's), here with 4 days of left upper lateral vision loss in both eyes.  Initially had a headache but no longer.  He saw ophthalmologist yesterday and had a normal eye exam but was sent to [facility] ED for stroke workup, had to leave to to 6 hour waiting time, returns today to complete workup.  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  No other CN nerve deficits, no motor deficit or ataxia.  Plan for CTA and likely need for MRI given stroke concern.  Not candidate for tnk, no LVO symptoms [MT]  1416 CTA unremarkable - pt advised for transfer to [facility] ED for MRI brain. I encouraged ambulance transport but patient refused. He understands the risks of traveling unmonitored including worsening symptoms, stroke, injury to himself or others, and death. He understands need to travel directly to [facility] hospital [[place]]    Clinical Course User Index [MT] [name], MD                                 Medical Decision Making Amount and/or Complexity of Data Reviewed Labs: ordered. Radiology: ordered.  Risk Prescription drug management.   Patient with visual field changes in setting of headache last week, however headache now resolved.  Symptoms are persistent.  Outpatient ophthalmology workup did not show ocular cause.  Currently being evaluated for stroke.  Overall CTA of the head and neck were reassuring.     Final diagnoses:  Visual field defect    ED Discharge Orders     None          Desiderio Chew, PA-C 11/03/23 1418    [name], MD 11/03/23 [phone]

## 2023-11-03 NOTE — H&P (Signed)
 History and Physical    Patient: Corey Nielsen FMW:[id number] DOB: 28-Oct-1945 DOA: 11/03/2023 DOS: the patient was seen and examined on 11/03/2023 PCP: Dwight Trula SQUIBB, MD  Patient coming from: Home  Chief Complaint:  Chief Complaint  Patient presents with   Visual Field Change    HPI: Corey Nielsen is a 78 y.o. male with history of HTN, HLD, type 2 diabetes mellitus with insulin  pump, diabetic neuropathy and retinopathy, BPH, migraines with aura, presenting with acute vision changes since Thursday.  Patient apparently had acute episode of visual changes with floating scintillating diamonds that covered his visual field with associated right-sided sharp than dull headache and some nausea.  Initially attributing his symptoms to migraines however he had never had visual changes like this in the past.  I have visited his ophthalmologist in the outpatient setting who noted left hemi and no pia/quadrantanopia.  I recommend patient go to the emergency department for further workup at that time.  Currently denies any extremity weakness, palpitations, difficulty speaking or talking, and his headache is resolved.  Denies any new medications.  He is very compliant with his insulin /diabetic management.  Uses an insulin  pump (had to be removed for MRI).  He is compliant with his other medications.  Denies fevers, shortness of breath, chest pain, palpitations, nausea, vomiting, abdominal pain.  On evaluation emergency department, CT scan initially negative, MRI noting right medial occipital lesion suggestive of acute/subacute infarct.    Review of Systems: As mentioned in the history of present illness. All other systems reviewed and are negative. Past Medical History:  Diagnosis Date   BENIGN PROSTATIC HYPERTROPHY 02/05/2007   DIABETES MELLITUS, TYPE II 02/05/2007   Type 1   Headache    migraine occasionally   History of swelling of feet    HYPERLIPIDEMIA 02/05/2007   Hypertension    Past Surgical  History:  Procedure Laterality Date   COLONOSCOPY WITH PROPOFOL  N/A 05/29/2014   Procedure: COLONOSCOPY WITH PROPOFOL ;  Surgeon: Gladis MARLA Louder, MD;  Location: [facility];  Service: Endoscopy;  Laterality: N/A;   KIDNEY STONE SURGERY  2007   NASAL SINUS SURGERY  1989   obstructing growth ( Dr.  Mitzie)   UMBILICAL HERNIA REPAIR  1996   Dr Nicholaus   Social History:  reports that he quit smoking about 37 years ago. His smoking use included cigarettes. He started smoking about 62 years ago. He has a 12.5 pack-year smoking history. He has never used smokeless tobacco. He reports current alcohol use. He reports that he does not use drugs.  Allergies  Allergen Reactions   Tetanus Toxoids Shortness Of Breath    Horse serum   Amoxicillin    Tetanus Immune Globulin Anxiety    Other Reaction(s): Angioedema    Family History  Problem Relation Age of Onset   Diabetes Mother    Heart disease Father    Early death Brother     Prior to Admission medications   Medication Sig Start Date End Date Taking? Authorizing Provider  mupirocin ointment (BACTROBAN) 2 % Apply 1 Application topically 2 (two) times daily. 10/22/23  Yes [provider]  atorvastatin  (LIPITOR) 20 MG tablet TAKE 1 TABLET EACH DAY 09/30/12   Jenkins, John E, MD  Coenzyme Q10 (CO Q 10 PO) Take 300 mg by mouth daily.    [provider]  cyanocobalamin  100 MCG tablet daily.    [provider]  dutasteride  (AVODART ) 0.5 MG capsule Take 0.5 mg by mouth daily.  [provider]  Dutasteride -Tamsulosin  HCl (JALYN ) 0.5-0.4 MG CAPS TAKE ONE (1) CAPSULE EACHDAY 09/30/12   Jenkins, John E, MD  ibuprofen (ADVIL) 800 MG tablet as needed for headache. 12/10/18   [provider]  Insulin  Human (INSULIN  PUMP) 100 unit/ml SOLN Inject into the skin. Insulin  pump with novolog  insulin     Jenkins, John E, MD  metFORMIN  (GLUCOPHAGE -XR) 500 MG 24 hr tablet Take 500 mg by mouth in the morning and at bedtime.  08/30/21   [provider]  Multiple Vitamins-Minerals (MULTIVITAMIN ADULTS PO) Take 1 tablet by mouth daily.    [provider]  NOVOLOG  100 UNIT/ML injection daily at 6 (six) AM. Use as directed    [provider]  [facility] DELICA LANCETS 33G MISC U UTD TO TEST BLOOD SUGARS 8 TIMES A DAY 11/01/15   [provider]  ONETOUCH VERIO test strip U TO TEST EIGHT TIMES D UTD 10/13/15   [provider]  OZEMPIC, 0.25 OR 0.5 MG/DOSE, 2 MG/3ML SOPN Inject into the skin once a week.    [provider]  pioglitazone  (ACTOS ) 30 MG tablet Take 1 tablet (30 mg total) by mouth daily. 09/30/12   [name], MD  tamsulosin  (FLOMAX ) 0.4 MG CAPS capsule Take 0.4 mg by mouth every 12 (twelve) hours.    [provider]  VASCEPA 1 g capsule TAKE 2 CAPSULES BY MOUTH TWICE DAILY WITH MEALS FOR TRIGLYCERIDES 08/07/21   [provider]    Physical Exam:  Vitals:   11/03/23 1345 11/03/23 1400 11/03/23 1415 11/03/23 1507  BP:  (!) 150/82  (!) 154/76  Pulse: 72 70 71 84  Resp: 20 13 17 18   Temp:    98.6 F (37 C)  TempSrc:      SpO2: 99% 97% 100% 97%    GENERAL:  Alert, pleasant, no acute distress  HEENT:  EOMI CARDIOVASCULAR:  RRR, no murmurs appreciated RESPIRATORY:  Clear to auscultation, no wheezing, rales, or rhonchi GASTROINTESTINAL:  Soft, nontender, nondistended EXTREMITIES:  No LE edema bilaterally NEURO: Left-sided visual field mostly intact however patient complaining of cartoonish appearance and visual field, no other new focal deficits appreciated SKIN:  No rashes noted PSYCH:  Appropriate mood and affect    Data Reviewed:  Imaging below reviewed.  MR BRAIN WO CONTRAST Result Date: 11/03/2023 CLINICAL DATA:  Neuro deficit, acute, stroke suspected EXAM: MRI HEAD WITHOUT CONTRAST TECHNIQUE: Multiplanar, multiecho pulse sequences of the brain and surrounding structures were obtained without intravenous contrast. COMPARISON:   CT angiogram of the head dated November 03, 2023. FINDINGS: Brain: There is restricted diffusion present in the right medial occipital lobe compatible with an acute nonhemorrhagic infarct in the posterior cerebral artery distribution. There is increased T2 signal also present within the subcortical white matter and there is some hemosiderin staining. There is mild to moderate periventricular white matter disease. 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 Vascular: Normal flow voids. Skull and upper cervical spine: Normal marrow signal. No lesions are evident. Sinuses/Orbits: Polypoid mucosal disease within the maxillary sinuses. The orbits are unremarkable. Mild mucosal disease within the mastoid air cells. Other: None. IMPRESSION: 1. Acute nonhemorrhagic infarct involving the right medial occipital lobe, possibly acute on chronic. 2. Mild to moderate periventricular white matter disease. Electronically Signed   By: Evalene Coho M.D.   On: 11/03/2023 16:16   CT ANGIO HEAD NECK W WO CM Result Date: 11/03/2023 CLINICAL DATA:  Neuro deficit, concern for stroke, bilateral temporal  visual field deficit. Headache and vision changes in the left eye. EXAM: CT ANGIOGRAPHY HEAD AND NECK WITH AND WITHOUT CONTRAST TECHNIQUE: Multidetector CT imaging of the head and neck was performed using the standard protocol during bolus administration of intravenous contrast. Multiplanar CT image reconstructions and MIPs were obtained to evaluate the vascular anatomy. Carotid stenosis measurements (when applicable) are obtained utilizing NASCET criteria, using the distal internal carotid diameter as the denominator. RADIATION DOSE REDUCTION: This exam was performed according to the departmental dose-optimization program which includes automated exposure control, adjustment of the mA and/or kV according to patient size and/or use of iterative reconstruction technique. CONTRAST:  75mL OMNIPAQUE  IOHEXOL  350 MG/ML SOLN  COMPARISON:  None Available. FINDINGS: CT HEAD FINDINGS Brain: No acute intracranial hemorrhage. No CT evidence of acute infarct. Nonspecific hypoattenuation in the periventricular and subcortical white matter favored to reflect chronic microvascular ischemic changes. No edema, mass effect, or midline shift. The basilar cisterns are patent. Retro cerebellar fluid with intact cerebellar vermis suggestive of mega cisterna magna. Ventricles: Ventricles are normal in size and configuration. Vascular: No hyperdense vessel. Skull: No acute or aggressive finding. Sinuses/orbits: Orbits are symmetric. Mucosal thickening and mucous retention cysts in the bilateral maxillary sinuses and left sphenoid sinus. Other: Mastoid air cells are clear. CTA NECK FINDINGS Aortic arch: Standard configuration of the aortic arch. Imaged portion shows no evidence of aneurysm or dissection. No significant stenosis of the major arch vessel origins. Pulmonary arteries: As permitted by contrast timing, there are no filling defects in the visualized pulmonary arteries. Subclavian arteries: The subclavian arteries are patent bilaterally. Right carotid system: No evidence of dissection, stenosis (50% or greater), or occlusion. Mild atherosclerosis at the carotid bifurcation. Left carotid system: No evidence of dissection, stenosis (50% or greater), or occlusion. Vertebral arteries: Codominant. No evidence of dissection, stenosis (50% or greater), or occlusion. Skeleton: No acute findings. Degenerative changes in the cervical spine. Disc space narrowing greatest at C5-6 and C6-7. Anterior endplate osteophytes at multiple levels in the cervical spine. Other neck: The visualized airway is patent. No cervical lymphadenopathy. Upper chest: No acute findings. Review of the MIP images confirms the above findings CTA HEAD FINDINGS ANTERIOR CIRCULATION: The intracranial ICAs are patent bilaterally. Mild atherosclerosis of the right carotid siphon. No  significant stenosis, proximal occlusion, aneurysm, or vascular malformation. MCAs: The middle cerebral arteries are patent bilaterally. ACAs: The anterior cerebral arteries are patent bilaterally. POSTERIOR CIRCULATION: No significant stenosis, proximal occlusion, aneurysm, or vascular malformation. PCAs: The posterior cerebral arteries are patent bilaterally. Pcomm: Not well visualized. SCAs: The superior cerebellar arteries are patent bilaterally. Basilar artery: Patent AICAs: Not well visualized. PICAs: Patent Vertebral arteries: The intracranial vertebral arteries are patent. Venous sinuses: As permitted by contrast timing, patent. Anatomic variants: None Review of the MIP images confirms the above findings IMPRESSION: No large vessel occlusion. No high-grade stenosis, aneurysm, or dissection of the arteries in the head and neck. No CT evidence of acute intracranial abnormality. Mild chronic microvascular ischemic changes. Electronically Signed   By: Donnice Mania M.D.   On: 11/03/2023 13:39    Assessment and Plan:  Acute/subacute right occipital CVA - Likely occurred a few days ago.  Right occipital CVA corresponding with patient's vision changes.  Patient's headache perhaps related.  Neurology consulted by ED provider.  CT angio noting no obvious stenosis or lesions.  CT head showing no acute bleed.  Will order echo with bubble study.  Continue on telemetry.  Will order lipids, A1c for a.m.  PT/OT/ST likely on needed so we will put on hold for now.  Discussed with patient about modifiable risk factors including hypertension, hyperlipidemia, diabetes mellitus.  Currently on aspirin 81 mg daily, increase atorvastatin  to 40 mg.  Will consider initiating Plavix.  Will need to be on 21 days DAPT.  Insulin -dependent diabetes mellitus - Patient normally uses insulin  pump and is very compliant/regimented with his glucose monitoring.  Had to remove insulin  pump for MRI.  Patient would prefer to reattach  insulin  pump if possible.  Will work with staff/pharmacy to heed patient's wishes.  In the meantime we will order insulin  sliding scale, Accu-Cheks AC at bedtime.  Carb consistent diet.  Hypertension/hyperlipidemia - No antihypertensives on MAR at this time.  Monitor blood pressure closely.  Atorvastatin  as above.  BPH - Flomax  on board.    Advance Care Planning:   Code Status: Full Code   Consults: Neurology  Family Communication: None  Severity of Illness: The appropriate patient status for this patient is OBSERVATION. Observation status is judged to be reasonable and necessary in order to provide the required intensity of service to ensure the patient's safety. The patient's presenting symptoms, physical exam findings, and initial radiographic and laboratory data in the context of their medical condition is felt to place them at decreased risk for further clinical deterioration. Furthermore, it is anticipated that the patient will be medically stable for discharge from the hospital within 2 midnights of admission.   Author: Carliss LELON Canales, DO 11/03/2023 5:32 PM  For on call review [email or web address].

## 2023-11-03 NOTE — Progress Notes (Signed)
 Neurology referral placed

## 2023-11-03 NOTE — Consult Note (Incomplete)
 NEUROLOGY CONSULT NOTE   Date of service: November 03, 2023 Patient Name: Corey Nielsen MRN:  [id number] DOB:  [birth year] Chief Complaint: visual field change Requesting Provider: Arlon Carliss ORN, DO  History of Present Illness  Corey Nielsen is a 78 y.o. male with hx of HTN, HLD, type 2 diabetes mellitus with insulin  pump, diabetic neuropathy and retinopathy, BPH, migraines with aura, presenting with acute vision changes since Thursday. Patient apparently had acute episode of visual changes with floating scintillating diamonds that covered his visual field with associated right-sided sharp than dull headache and some nausea. Initially attributing his symptoms to migraines however he had never had visual changes like this in the past.   CT scan negative, CT angio noting no obvious stenosis or lesions MRI noting right medial occipital lesion suggestive of acute/subacute infarct. Neurology consulted for stroke.   Patient saw opthalmology 6/30 and had normal eye exam, but was sent to [facility] for stroke workup. Due to a 6 hour wait time, he left and came to [facility] ED for workup today, then transferred to [facility]. Patient declined ambulance transport and arrived in private vehicle.  He was previously as Aspirin but stated he stopped taking it years ago.   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 No confusion, focal or sensory deficits, no aphasia or dysarthria.Denies headache.   ROS  Comprehensive ROS performed and pertinent positives documented in HPI   Past History   Past Medical History:  Diagnosis Date   BENIGN PROSTATIC HYPERTROPHY 02/05/2007   DIABETES MELLITUS, TYPE II 02/05/2007   Type 1   Headache    migraine occasionally   History of swelling of feet    HYPERLIPIDEMIA 02/05/2007   Hypertension     Past Surgical History:  Procedure Laterality Date   COLONOSCOPY WITH PROPOFOL  N/A 05/29/2014   Procedure: COLONOSCOPY WITH PROPOFOL ;  Surgeon:  Gladis MARLA Louder, MD;  Location: [facility];  Service: Endoscopy;  Laterality: N/A;   KIDNEY STONE SURGERY  2007   NASAL SINUS SURGERY  1989   obstructing growth ( Dr.  Mitzie)   UMBILICAL HERNIA REPAIR  1996   Dr Nicholaus    Family History: Family History  Problem Relation Age of Onset   Diabetes Mother    Heart disease Father    Early death Brother     Social History  reports that he quit smoking about 37 years ago. His smoking use included cigarettes. He started smoking about 62 years ago. He has a 12.5 pack-year smoking history. He has never used smokeless tobacco. He reports current alcohol use. He reports that he does not use drugs.  Allergies  Allergen Reactions   Tetanus Toxoids Shortness Of Breath    Horse serum   Amoxicillin    Tetanus Immune Globulin Anxiety    Other Reaction(s): Angioedema    Medications   Current Facility-Administered Medications:    [START ON 11/04/2023]  stroke: early stages of recovery book, , Does not apply, Once, Calkins, Derek W, DO   acetaminophen (TYLENOL) tablet 650 mg, 650 mg, Oral, Q4H PRN **OR** acetaminophen (TYLENOL) 160 MG/5ML solution 650 mg, 650 mg, Per Tube, Q4H PRN **OR** acetaminophen (TYLENOL) suppository 650 mg, 650 mg, Rectal, Q4H PRN, Arlon, Derek W, DO   aspirin EC tablet 81 mg, 81 mg, Oral, Daily, Calkins, Derek W, DO   atorvastatin  (LIPITOR) tablet 80 mg, 80 mg, Oral, Daily, Calkins, Derek W, DO   [START ON 11/04/2023] Dutasteride -Tamsulosin  HCl 0.5-0.4 MG CAPS, ,  Oral, Q0600, Arlon Carliss ORN, DO   heparin injection 5,000 Units, 5,000 Units, Subcutaneous, Q8H, Arlon Carliss ORN, DO, 5,000 Units at 11/03/23 1749   [START ON 11/04/2023] insulin  aspart (novoLOG ) injection 0-15 Units, 0-15 Units, Subcutaneous, TID WC, Calkins, Derek W, DO   insulin  aspart (novoLOG ) injection 0-5 Units, 0-5 Units, Subcutaneous, QHS, Calkins, Derek W, DO   insulin  pump, , Subcutaneous, TID WC, HS, 0200, Calkins, Derek W, DO   senna-docusate  (Senokot-S) tablet 1 tablet, 1 tablet, Oral, QHS PRN, Arlon, Derek W, DO   sodium chloride  flush (NS) 0.9 % injection 3-10 mL, 3-10 mL, Intravenous, Q12H, Calkins, Derek W, DO   sodium chloride  flush (NS) 0.9 % injection 3-10 mL, 3-10 mL, Intravenous, PRN, Arlon, Derek W, DO   triamcinolone  acetonide (KENALOG ) 10 MG/ML injection 10 mg, 10 mg, Other, Once, Regal, [place] RAMAN, DPM  Current Outpatient Medications:    mupirocin ointment (BACTROBAN) 2 %, Apply 1 Application topically 2 (two) times daily., Disp: , Rfl:    atorvastatin  (LIPITOR) 20 MG tablet, TAKE 1 TABLET EACH DAY (Patient taking differently: Take 20 mg by mouth daily.), Disp: 90 tablet, Rfl: 3   Coenzyme Q10 (CO Q 10 PO), Take 300 mg by mouth daily., Disp: , Rfl:    cyanocobalamin  100 MCG tablet, Take 100 mcg by mouth daily., Disp: , Rfl:    dutasteride  (AVODART ) 0.5 MG capsule, Take 0.5 mg by mouth daily., Disp: , Rfl:    Dutasteride -Tamsulosin  HCl (JALYN ) 0.5-0.4 MG CAPS, TAKE ONE (1) CAPSULE EACHDAY, Disp: 90 capsule, Rfl: 3   ibuprofen (ADVIL) 800 MG tablet, as needed for headache., Disp: , Rfl:    Insulin  Human (INSULIN  PUMP) 100 unit/ml SOLN, Inject into the skin. Insulin  pump with novolog  insulin , Disp: , Rfl:    metFORMIN  (GLUCOPHAGE -XR) 500 MG 24 hr tablet, Take 500 mg by mouth in the morning and at bedtime., Disp: , Rfl:    Multiple Vitamins-Minerals (MULTIVITAMIN ADULTS PO), Take 1 tablet by mouth daily., Disp: , Rfl:    NOVOLOG  100 UNIT/ML injection, daily at 6 (six) AM. Use as directed, Disp: , Rfl:    ONETOUCH DELICA LANCETS 33G MISC, U UTD TO TEST BLOOD SUGARS 8 TIMES A DAY, Disp: , Rfl: 11   ONETOUCH VERIO test strip, U TO TEST EIGHT TIMES D UTD, Disp: , Rfl: 11   OZEMPIC, 0.25 OR 0.5 MG/DOSE, 2 MG/3ML SOPN, Inject into the skin once a week., Disp: , Rfl:    pioglitazone  (ACTOS ) 30 MG tablet, Take 1 tablet (30 mg total) by mouth daily., Disp: 90 tablet, Rfl: 3   tamsulosin  (FLOMAX ) 0.4 MG CAPS capsule, Take 0.4 mg by  mouth every 12 (twelve) hours., Disp: , Rfl:    VASCEPA 1 g capsule, TAKE 2 CAPSULES BY MOUTH TWICE DAILY WITH MEALS FOR TRIGLYCERIDES, Disp: , Rfl:   Vitals   Vitals:   11/03/23 1400 11/03/23 1415 11/03/23 1507 11/03/23 1751  BP: (!) 150/82  (!) 154/76   Pulse: 70 71 84   Resp: 13 17 18    Temp:   98.6 F (37 C)   TempSrc:      SpO2: 97% 100% 97%   Weight:    106.1 kg  Height:    6' 5 (1.956 m)    Body mass index is 27.74 kg/m.   Physical Exam   Constitutional: Appears well-developed and well-nourished.  Psych: Affect appropriate to situation.  Cardiovascular: Normal rate and regular rhythm.  Respiratory: Effort normal, non-labored breathing.   Neurologic  Examination   Neuro: Mental Status: Patient is awake, alert, oriented to person, place, month, year, and situation. Patient is able to give a clear and coherent history. No signs of aphasia or neglect Cranial Nerves: II:  Pupils are equal, round, and reactive to light.    Left superior quadrantanopia III,IV, VI: EOMI without ptosis or diploplia.  V: Facial sensation is symmetric to temperature VII: Facial movement is symmetric.  VIII: hearing is intact to voice X: Uvula elevates symmetrically XI: Shoulder shrug is symmetric. XII: tongue is midline without atrophy or fasciculations.  Motor: Tone is normal. Bulk is normal. 5/5 strength was present in all four extremities.  Sensory: Sensation is symmetric to light touch and temperature in the arms and legs. Cerebellar: FNF and HKS are intact bilaterally   Labs/Imaging/Neurodiagnostic studies   CBC:  Recent Labs  Lab 11-10-23 1040  WBC 7.0  NEUTROABS 4.2  HGB 13.6  HCT 39.4  MCV 92.7  PLT 212   Basic Metabolic Panel:  Lab Results  Component Value Date   NA 138 2023/11/10   K 4.1 2023-11-10   CO2 24 11/10/2023   GLUCOSE 165 (H) 2023-11-10   BUN 26 (H) 10-Nov-2023   CREATININE 1.01 11-10-23   CALCIUM  9.2 Nov 10, 2023   GFRNONAA >60 2023-11-10    GFRAA 97 03/07/2008   Lipid Panel:  Lab Results  Component Value Date   LDLCALC 62 10/16/2011   HgbA1c:  Lab Results  Component Value Date   HGBA1C 8.1 (H) 10/16/2011   Urine Drug Screen: No results found for: LABOPIA, COCAINSCRNUR, LABBENZ, AMPHETMU, THCU, LABBARB  Alcohol Level No results found for: [facility] INR  Lab Results  Component Value Date   INR 1.0 2023-11-10   APTT  Lab Results  Component Value Date   APTT 28 11/10/2023   AED levels: No results found for: PHENYTOIN, ZONISAMIDE, LAMOTRIGINE, LEVETIRACETA  CT Head without contrast(Personally reviewed): No CT evidence of acute intracranial abnormality.   CT angio Head and Neck with contrast(Personally reviewed): No large vessel occlusion. No high-grade stenosis, aneurysm, or dissection of the arteries in the head and neck.  MRI Brain(Personally reviewed): Acute nonhemorrhagic infarct involving the right medial occipital lobe, possibly acute on chronic. Mild to moderate periventricular white matter disease.   ASSESSMENT   Corey Nielsen is a 78 y.o. male with hx of HTN, HLD, type 2 diabetes mellitus with insulin  pump, diabetic neuropathy and retinopathy, BPH, migraines with aura, presenting with acute vision changes since Thursday.  MRI showing right medial occipital lesion suggestive of acute/subacute infarct.  Left superior quadrantanopia on exam with no there neurological deficits.   Impression: Acute Ischemic Infarct, likely embolic.  RECOMMENDATIONS  - Frequent Neuro checks per stroke unit protocol - MRI Brain stroke protocol - TTE w/bubble study  - Lipid panel - Statin - will be started if LDL>70 or otherwise medically indicated  Patient on Lipitor 20mg  PTA - A1C - Antithrombotic - lovenox - DVT ppx - ASA/Plavix for 21 days, then Aspirin alone - SBP goal - normotension as patient is outside of the permissive hypertension window - Telemetry monitoring for arrhythmia - 72h -  Swallow screen - will be performed prior to PO intake - Stroke education - will be given - PT/OT/SLP - Dispo: Admit for stroke workup  ______________________________________________________________________    Signed, Rocky JAYSON Likes, NP [facility]   NEUROHOSPITALIST ADDENDUM Performed a face to face diagnostic evaluation.   I have reviewed the contents of history and physical exam as documented  by PA/ARNP/Resident and agree with above documentation.  I have discussed and formulated the above plan as documented. Edits to the note have been made as needed.  Impression/Key exam findings/Plan: vision changes since Thursday with spot of blury vision in left superior visual field. MRI brain confirms a R occipital lobe infarct. The stroke is cortical and subcortical with no large vessel occlusion or significant stenosis. This raises suspicion for a potential embolic etiology. No prior hx of Afibb.  Consider cardiac monitoring or loop recorder in addition to standard stroke workup. Continue Asa and plavix.  Plan discussed with patient in detail at the bedside.  Salman Khaliqdina, MD [facility] [phone]   If 7pm to 7am, please call on call as listed on AMION.

## 2023-11-04 ENCOUNTER — Observation Stay (HOSPITAL_BASED_OUTPATIENT_CLINIC_OR_DEPARTMENT_OTHER)

## 2023-11-04 DIAGNOSIS — I10 Essential (primary) hypertension: Secondary | ICD-10-CM | POA: Diagnosis not present

## 2023-11-04 DIAGNOSIS — I6389 Other cerebral infarction: Secondary | ICD-10-CM | POA: Diagnosis not present

## 2023-11-04 DIAGNOSIS — I639 Cerebral infarction, unspecified: Secondary | ICD-10-CM | POA: Diagnosis not present

## 2023-11-04 DIAGNOSIS — E785 Hyperlipidemia, unspecified: Secondary | ICD-10-CM | POA: Diagnosis not present

## 2023-11-04 DIAGNOSIS — R29702 NIHSS score 2: Secondary | ICD-10-CM | POA: Diagnosis not present

## 2023-11-04 LAB — GLUCOSE, CAPILLARY
Glucose-Capillary: 256 mg/dL — ABNORMAL HIGH (ref 70–99)
Glucose-Capillary: 176 mg/dL — ABNORMAL HIGH (ref 70–99)
Glucose-Capillary: 209 mg/dL — ABNORMAL HIGH (ref 70–99)

## 2023-11-04 LAB — ECHOCARDIOGRAM COMPLETE
Area-P 1/2: 3.53 cm2
Height: 77 in
S' Lateral: 3.3 cm
Weight: 3742.53 [oz_av]

## 2023-11-04 LAB — LIPID PANEL
Cholesterol: 132 mg/dL (ref 0–200)
HDL: 49 mg/dL (ref >40)
LDL Cholesterol: 65 mg/dL (ref 0–99)
Total CHOL/HDL Ratio: 2.7 ratio
Triglycerides: 92 mg/dL (ref <150)
VLDL: 18 mg/dL (ref 0–40)

## 2023-11-04 LAB — CBG MONITORING, ED
Glucose-Capillary: 205 mg/dL — ABNORMAL HIGH (ref 70–99)
Glucose-Capillary: 211 mg/dL — ABNORMAL HIGH (ref 70–99)
Glucose-Capillary: 214 mg/dL — ABNORMAL HIGH (ref 70–99)

## 2023-11-04 NOTE — TOC Transition Note (Signed)
 Transition of Care [facility]) - Discharge Note   Patient Details  Name: Corey Nielsen MRN: [id number] Date of Birth: [birth year]  Transition of [facility] CM/SW Contact:  Andrez JULIANNA George, RN Phone Number: 11/04/2023, 3:29 PM   Clinical Narrative:     Pt is from home alone. He has a friend in the neighborhood and another friend that can check in on him. One of the friends can assist with transportation. Pt manages his own medications and denies any issues.  No further needs per TOC.  Final next level of care: Home/Self Care Barriers to Discharge: No Barriers Identified   Patient Goals and CMS Choice            Discharge Placement                       Discharge Plan and Services Additional resources added to the After Visit Summary for                                       Social Drivers of Health (SDOH) Interventions SDOH Screenings   Food Insecurity: No Food Insecurity (11/04/2023)  Housing: Low Risk  (11/04/2023)  Transportation Needs: No Transportation Needs (11/04/2023)  Utilities: Not At Risk (11/04/2023)  Social Connections: Socially Isolated (11/04/2023)  Tobacco Use: Medium Risk (11/03/2023)     Readmission Risk Interventions     No data to display

## 2023-11-04 NOTE — Assessment & Plan Note (Signed)
-   LDL 65 - Continue Lipitor

## 2023-11-04 NOTE — Assessment & Plan Note (Signed)
-   Continue dutasteride  and Flomax

## 2023-11-04 NOTE — Inpatient Diabetes Management (Signed)
 Inpatient Diabetes Program Recommendations  AACE/ADA: New Consensus Statement on Inpatient Glycemic Control (2015)  Target Ranges:  Prepandial:   less than 140 mg/dL      [facility] postprandial:   less than 180 mg/dL (1-2 hours)      Critically ill patients:  140 - 180 mg/dL   Lab Results  Component Value Date   GLUCAP 214 (H) 11/04/2023   HGBA1C 7.0 (H) 11/03/2023    Review of Glycemic Control  Latest Reference Range & Units 11/03/23 20:35 11/03/23 22:28 11/03/23 23:59 11/04/23 03:19 11/04/23 07:36  Glucose-Capillary 70 - 99 mg/dL 834 (H) 767 (H) 794 (H) 211 (H) 214 (H)  (H): Data is abnormally high  Diabetes history: DM2 Outpatient Diabetes medications:  Semaglutide (Ozempic) 0.5 mg weekly  Metformin  ER 1000 mg daily  pioglitazone  30 mg daily  Insulin  Pump Settings  Time: Basal:           ICR: ISF: Target:  00:00 0.520 units/h 9      32     100  03:00 0.550 units/h 9      32     100  06:00 0.450 units/h 9      32     100  10:00 0.500 units/h 7      28     100  13:00 0.510 units/h 7      28     100  16:00 0.540 units/h 7      28     100  18:00 0.510 units/h 7      28     100  21:00 0.530 units/h 7      28     100  ================  24-h total basal: 19.8 units   Current orders for Inpatient glycemic control:  Novolog  0-15 units TID and 0-5 units at bedtime Insulin  pump  Inpatient Diabetes Program Recommendations:    If patient remains inpatient, please consider:  Semglee  units every day (50% of home dose)  Met with patient at bedside.  He normally wears a Tandem T-Slim insulin  pump with Novolog  and uses the Dexcom G7 CGM.  The pump and CGM were removed for imaging.  He does not have any pump supplies with him in the hospital so cannot start pump back until he goes home.  If he goes home today do not add Semglee as he will not be able to place his pump back on for 24hrs after basal administration.  I will provide him with a sample Dexcom G7 to replace his CGM.    Thank  you, Wyvonna Pinal, MSN, CDCES Diabetes Coordinator Inpatient Diabetes Program [phone] (team pager from 8a-5p)

## 2023-11-04 NOTE — Care Management Obs Status (Signed)
 MEDICARE OBSERVATION STATUS NOTIFICATION   Patient Details  Name: Corey Nielsen MRN: [id number] Date of Birth: 1945/06/27   Medicare Observation Status Notification Given:     LETTER SIGNED AND COPY GIVEN  [name] 11/04/2023, 11:53 AM

## 2023-11-04 NOTE — Care Management Obs Status (Signed)
 MEDICARE OBSERVATION STATUS NOTIFICATION   Patient Details  Name: Corey Nielsen MRN: [id number] Date of Birth: 11-14-1945   Medicare Observation Status Notification Given:  Yes    [name] 11/04/2023, 1:20 PM

## 2023-11-04 NOTE — H&P (View-Only) (Signed)
 ELECTROPHYSIOLOGY CONSULT NOTE  Patient ID: Corey Nielsen MRN: [id number], DOB/AGE: [birth year]   Admit date: 11/03/2023 Date of Consult: 11/04/2023  Primary Physician: Dwight Trula SQUIBB, MD Primary Cardiologist: Dr. Court Reason for Consultation: Cryptogenic stroke ; recommendations regarding Implantable Loop Recorder, requested by Dr. Rosemarie  History of Present Illness Corey Nielsen was admitted on 11/03/2023 with persistent visual changes, headache (several days, initially suspected a migraine) > eventually sought attention at his eye MD, with reportedly a normal exam > went to [facility] though ultimately left 2/2 long wait time > [facility] left and came to [facility] via personal vehicle and found to have acute/subacute stroke    PMHx includes: Migraine HAs, HLD, DM (w/neuropathy), HTN, coronary CTA 05/27/2022 revealing a coronary calcium  score of 470 majority which was in the LAD which was FFR negative for significance   Neurology notes:  Acute on chronic right medial occipital lobe Etiology:  concern for embolic.  he has undergone workup for stroke including echocardiogram and carotid dopplers.  The patient has been monitored on telemetry which has demonstrated sinus rhythm with no arrhythmias.    Neurology has deferred TEE Echocardiogram this admission demonstrated    1. Left ventricular ejection fraction, by estimation, is 55 to 60%. The  left ventricle has normal function. The left ventricle has no regional  wall motion abnormalities. There is mild concentric left ventricular  hypertrophy. Left ventricular diastolic  parameters are consistent with Grade I diastolic dysfunction (impaired  relaxation).   2. Right ventricular systolic function is normal. The right ventricular  size is mildly enlarged. Tricuspid regurgitation signal is inadequate for  assessing PA pressure.   3. The mitral valve is normal in structure. Trivial mitral valve  regurgitation. No evidence of mitral stenosis.   4.  The aortic valve is tricuspid. There is mild calcification of the  aortic valve. Aortic valve regurgitation is trivial. No aortic stenosis is  present.   5. The inferior vena cava is normal in size with greater than 50%  respiratory variability, suggesting right atrial pressure of 3 mmHg.   6. Positive bubble study is suggestive of a PFO.   Pending LE venous dopplers  Lab work is reviewed. VSS (hypertension noted-deferred to attending team), afebrile  Prior to admission, the patient denies chest pain, shortness of breath, dizziness, palpitations, or syncope.  They are recovering from their stroke with plans to home at discharge.   Past Medical History:  Diagnosis Date   BENIGN PROSTATIC HYPERTROPHY 02/05/2007   DIABETES MELLITUS, TYPE II 02/05/2007   Type 1   Headache    migraine occasionally   History of swelling of feet    HYPERLIPIDEMIA 02/05/2007   Hypertension      Surgical History:  Past Surgical History:  Procedure Laterality Date   COLONOSCOPY WITH PROPOFOL  N/A 05/29/2014   Procedure: COLONOSCOPY WITH PROPOFOL ;  Surgeon: Gladis MARLA Louder, MD;  Location: [facility];  Service: Endoscopy;  Laterality: N/A;   KIDNEY STONE SURGERY  2007   NASAL SINUS SURGERY  1989   obstructing growth ( Dr.  Mitzie)   UMBILICAL HERNIA REPAIR  1996   Dr Nicholaus     Facility-Administered Medications Prior to Admission  Medication Dose Route Frequency Provider Last Rate Last Admin   triamcinolone  acetonide (KENALOG ) 10 MG/ML injection 10 mg  10 mg Other Once Regal, Norman S, DPM       Medications Prior to Admission  Medication Sig Dispense Refill Last Dose/Taking   atorvastatin  (LIPITOR)  11/03/2023 Morning   mupirocin ointment (BACTROBAN) 2 % Apply 1 Application topically 2 (two) times daily.   11/03/2023 Morning   pioglitazone  (ACTOS ) 30 MG tablet Take 1 tablet (30 mg total) by mouth daily. 90 tablet 3 11/03/2023 Morning   tamsulosin  (FLOMAX ) 0.4 MG CAPS capsule Take 0.4 mg by mouth every 12 (twelve) hours.   11/03/2023 Morning   VASCEPA 1 g capsule TAKE 2 CAPSULES BY MOUTH TWICE DAILY WITH MEALS FOR TRIGLYCERIDES   11/03/2023 Morning    ONETOUCH DELICA LANCETS 33G MISC U UTD TO TEST BLOOD SUGARS 8 TIMES A DAY  11    ONETOUCH VERIO test strip U TO TEST EIGHT TIMES D UTD  11    OZEMPIC, 0.25 OR 0.5 MG/DOSE, 2 MG/3ML SOPN Inject into the skin once a week. (Patient not taking: Reported on 11/03/2023)   Not Taking    Inpatient Medications:   aspirin EC  81 mg Oral Daily   atorvastatin   80 mg Oral Daily   clopidogrel  75 mg Oral Daily   dutasteride   0.5 mg Oral Daily   And   tamsulosin   0.4 mg Oral Daily   heparin  5,000 Units Subcutaneous Q8H   insulin  aspart  0-15 Units Subcutaneous TID WC   insulin  aspart  0-5 Units Subcutaneous QHS   insulin  pump   Subcutaneous TID WC, HS, 0200   sodium chloride  flush  3-10 mL Intravenous Q12H    Allergies:  Allergies  Allergen Reactions   Tetanus Toxoids Shortness Of Breath    Horse serum   Amoxicillin    Tetanus Immune Globulin Anxiety    Other Reaction(s): Angioedema    Social History   Socioeconomic History   Marital status: Widowed    Spouse name: Not on file   Number of children: Not on file   Years of education: Not on file   Highest education level: Not on file  Occupational History   Not on file  Tobacco Use   Smoking status: Former    Current packs/day: 0.00    Average packs/day: 0.5 packs/day for 25.0 years (12.5 ttl pk-yrs)    Types: Cigarettes    Start date: 05/05/1961    Quit date: 05/05/1986    Years since quitting: 37.5   Smokeless tobacco: Never  Substance and Sexual Activity   Alcohol use: Yes    Comment: rare   Drug use: No   Sexual activity: Not on file  Other Topics Concern   Not on file  Social History Narrative   Not on file   Social Drivers of Health   Financial Resource Strain: Not on file  Food Insecurity: No Food Insecurity (11/04/2023)   Hunger Vital Sign    Worried About Running Out of Food in the Last Year: Never true    Ran Out of Food in the Last Year: Never true  Transportation Needs: No Transportation Needs (11/04/2023)    PRAPARE - Administrator, Civil Service (Medical): No    Lack of Transportation (Non-Medical): No  Physical Activity: Not on file  Stress: Not on file  Social Connections: Socially Isolated (11/04/2023)   Social Connection and Isolation Panel    Frequency of Communication with Friends and Family: More than three times a week    Frequency of Social Gatherings with Friends and Family: More than three times a week    Attends Religious Services: Never    Database administrator or Organizations: No    Attends Club or  Organization Meetings: Never    Marital Status: Widowed  Intimate Partner Violence: Not At Risk (11/04/2023)   Humiliation, Afraid, Rape, and Kick questionnaire    Fear of Current or Ex-Partner: No    Emotionally Abused: No    Physically Abused: No    Sexually Abused: No     Family History  Problem Relation Age of Onset   Diabetes Mother    Heart disease Father    Early death Brother       Review of Systems: All other systems reviewed and are otherwise negative except as noted above.  Physical Exam: Vitals:   11/03/23 1751 11/04/23 0437 11/04/23 0600 11/04/23 0812  BP:  (!) 155/76 (!) 142/78 (!) 152/92  Pulse:  69 73 74  Resp:  20  17  Temp:  97.9 F (36.6 C)  97.9 F (36.6 C)  TempSrc:  Oral  Oral  SpO2:  98% 97% 100%  Weight: 106.1 kg     Height: 6' 5 (1.956 m)   6' 5 (1.956 m)   *** GEN- The patient is well appearing, alert and oriented x 3 today.   Head- normocephalic, atraumatic Eyes-  Sclera clear, conjunctiva pink Ears- hearing intact Oropharynx- clear Neck- supple Lungs- *** CTA b/l, normal work of breathing Heart- *** RRR, no murmurs, rubs or gallops  GI- soft, NT, ND Extremities- no clubbing, cyanosis, or edema MS- no significant deformity or atrophy Skin- no rash or lesion Psych- euthymic mood, full affect   Labs:   Lab Results  Component Value Date   WBC 7.0 11/03/2023   HGB 13.6 11/03/2023   HCT 39.4 11/03/2023   MCV  92.7 11/03/2023   PLT 212 11/03/2023    Recent Labs  Lab 11/03/23 1040  NA 138  K 4.1  CL 105  CO2 24  BUN 26*  CREATININE 1.01  CALCIUM  9.2  PROT 6.2*  BILITOT 0.9  ALKPHOS 76  ALT 15  AST 20  GLUCOSE 165*   No results found for: CKTOTAL, CKMB, CKMBINDEX, TROPONINI Lab Results  Component Value Date   CHOL 132 11/04/2023   CHOL 129 10/16/2011   CHOL 125 02/13/2010   Lab Results  Component Value Date   HDL 49 11/04/2023   HDL 48.10 10/16/2011   HDL 33.70 (L) 02/13/2010   Lab Results  Component Value Date   LDLCALC 65 11/04/2023   LDLCALC 62 10/16/2011   LDLCALC 66 02/13/2010   Lab Results  Component Value Date   TRIG 92 11/04/2023   TRIG 93.0 10/16/2011   TRIG 129.0 02/13/2010   Lab Results  Component Value Date   CHOLHDL 2.7 11/04/2023   CHOLHDL 3 10/16/2011   CHOLHDL 4 02/13/2010   No results found for: LDLDIRECT  No results found for: DDIMER   Radiology/Studies:   MR BRAIN WO CONTRAST Result Date: 11/03/2023 CLINICAL DATA:  Neuro deficit, acute, stroke suspected EXAM: MRI HEAD WITHOUT CONTRAST TECHNIQUE: Multiplanar, multiecho pulse sequences of the brain and surrounding structures were obtained without intravenous contrast. COMPARISON:  CT angiogram of the head dated November 03, 2023. FINDINGS: Brain: There is restricted diffusion present in the right medial occipital lobe compatible with an acute nonhemorrhagic infarct in the posterior cerebral artery distribution. There is increased T2 signal also present within the subcortical white matter and there is some hemosiderin staining. There is mild to moderate periventricular white matter disease. 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 Vascular: Normal flow voids. Skull and upper cervical spine: Normal  TRIG 129.0 02/13/2010   Lab Results  Component Value Date   CHOLHDL 2.7 11/04/2023   CHOLHDL 3 10/16/2011   CHOLHDL 4 02/13/2010   No results found for: LDLDIRECT  No results found for: DDIMER    Radiology/Studies:   MR BRAIN WO CONTRAST Result Date: 11/03/2023 CLINICAL DATA:  Neuro deficit, acute, stroke suspected EXAM: MRI HEAD WITHOUT CONTRAST TECHNIQUE: Multiplanar, multiecho pulse sequences of the brain and surrounding structures were obtained without intravenous contrast. COMPARISON:  CT angiogram of the head dated November 03, 2023. FINDINGS: Brain: There is restricted diffusion present in the right medial occipital lobe compatible with an acute nonhemorrhagic infarct in the posterior cerebral artery distribution. There is increased T2 signal also present within the subcortical white matter and there is some hemosiderin staining. There is mild to moderate periventricular white matter disease. 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 Vascular: Normal flow voids. Skull and upper cervical spine: Normal marrow signal. No lesions are evident. Sinuses/Orbits: Polypoid mucosal disease within the maxillary sinuses. The orbits are unremarkable. Mild mucosal disease within the mastoid air cells. Other: None. IMPRESSION: 1. Acute nonhemorrhagic infarct involving the right medial occipital lobe, possibly acute on chronic. 2. Mild to moderate periventricular white matter disease. Electronically Signed   By: Evalene Coho M.D.   On: 11/03/2023 16:16   CT ANGIO HEAD NECK W WO CM Result Date: 11/03/2023 CLINICAL DATA:  Neuro deficit, concern for stroke, bilateral temporal visual field deficit. Headache and vision changes in the left eye. EXAM: CT ANGIOGRAPHY HEAD AND NECK WITH AND WITHOUT CONTRAST TECHNIQUE: Multidetector CT imaging of the head and neck was performed using the standard protocol during bolus administration of intravenous contrast. Multiplanar CT image reconstructions and MIPs were obtained to evaluate the vascular anatomy. Carotid stenosis measurements (when applicable) are obtained utilizing NASCET criteria, using the distal internal carotid diameter as the denominator. RADIATION  DOSE REDUCTION: This exam was performed according to the departmental dose-optimization program which includes automated exposure control, adjustment of the mA and/or kV according to patient size and/or use of iterative reconstruction technique. CONTRAST:  75mL OMNIPAQUE  IOHEXOL  350 MG/ML SOLN COMPARISON:  None Available. FINDINGS: CT HEAD FINDINGS Brain: No acute intracranial hemorrhage. No CT evidence of acute infarct. Nonspecific hypoattenuation in the periventricular and subcortical white matter favored to reflect chronic microvascular ischemic changes. No edema, mass effect, or midline shift. The basilar cisterns are patent. Retro cerebellar fluid with intact cerebellar vermis suggestive of mega cisterna magna. Ventricles: Ventricles are normal in size and configuration. Vascular: No hyperdense vessel. Skull: No acute or aggressive finding. Sinuses/orbits: Orbits are symmetric. Mucosal thickening and mucous retention cysts in the bilateral maxillary sinuses and left sphenoid sinus. Other: Mastoid air cells are clear. CTA NECK FINDINGS Aortic arch: Standard configuration of the aortic arch. Imaged portion shows no evidence of aneurysm or dissection. No significant stenosis of the major arch vessel origins. Pulmonary arteries: As permitted by contrast timing, there are no filling defects in the visualized pulmonary arteries. Subclavian arteries: The subclavian arteries are patent bilaterally. Right carotid system: No evidence of dissection, stenosis (50% or greater), or occlusion. Mild atherosclerosis at the carotid bifurcation. Left carotid system: No evidence of dissection, stenosis (50% or greater), or occlusion. Vertebral arteries: Codominant. No evidence of dissection, stenosis (50% or greater), or occlusion. Skeleton: No acute findings. Degenerative changes in the cervical spine. Disc space narrowing greatest at C5-6 and C6-7. Anterior endplate osteophytes at multiple levels in the cervical spine. Other  neck: The  visualized airway is patent. No cervical lymphadenopathy. Upper chest: No acute findings. Review of the MIP images confirms the above findings CTA HEAD FINDINGS ANTERIOR CIRCULATION: The intracranial ICAs are patent bilaterally. Mild atherosclerosis of the right carotid siphon. No significant stenosis, proximal occlusion, aneurysm, or vascular malformation. MCAs: The middle cerebral arteries are patent bilaterally. ACAs: The anterior cerebral arteries are patent bilaterally. POSTERIOR CIRCULATION: No significant stenosis, proximal occlusion, aneurysm, or vascular malformation. PCAs: The posterior cerebral arteries are patent bilaterally. Pcomm: Not well visualized. SCAs: The superior cerebellar arteries are patent bilaterally. Basilar artery: Patent AICAs: Not well visualized. PICAs: Patent Vertebral arteries: The intracranial vertebral arteries are patent. Venous sinuses: As permitted by contrast timing, patent. Anatomic variants: None Review of the MIP images confirms the above findings IMPRESSION: No large vessel occlusion. No high-grade stenosis, aneurysm, or dissection of the arteries in the head and neck. No CT evidence of acute intracranial abnormality. Mild chronic microvascular ischemic changes. Electronically Signed   By: Donnice Mania M.D.   On: 11/03/2023 13:39    12-lead ECG none this admission noted in Epic or his physical chart All EKG's in EPIC reviewed with no documented atrial fibrillation  Telemetry SR, rare PVC  Assessment and Plan:  1. Cryptogenic stroke The patient presents with cryptogenic stroke.   I spoke at length with the patient about monitoring for afib with either a 30 day event monitor or an implantable loop recorder.  Risks, benefits, and alteratives to implantable loop recorder were discussed with the patient today.   At this time, the patient is very clear in his decision to proceed with implantable loop recorder, pending the findings of his LE venous  US /dopplers   ADDEND: Prelim result: Summary:  BILATERAL:  - No evidence of deep vein thrombosis seen in the lower extremities,  bilaterally.  -No evidence of popliteal cyst, bilaterally.    We will proceed with loop implant Wound care instructions reviewed with pt Please call with questions.   Renee Macario Arthur, PA-C 11/04/2023  I have seen, examined the patient, and reviewed the above assessment and plan.    HPI: Mr. Bisping is a 78 year old male with a PMH notable for migraine HAs, HLD, DM (w/neuropathy), HTN, coronary CTA 05/27/2022 revealing a coronary calcium  score of 470 who presented to ED with visual changes, headache. Imaging revealed acute on chronic right medial occipital lobe infarct. EP has been consulted for loop recorder implant.  General: Well developed, in no acute distress.  Neck: No JVD.  Cardiac: Normal rate, regular rhythm.  Resp: Normal work of breathing.  Ext: No edema.  Neuro: No gross focal deficits.  Psych: Normal affect.   Assessment and Plan:  #. Cryptogenic stroke: Discussed with patient atrial fibrillation as possible etiology for cryptogenic stroke. Discussed role of extended monitoring for atrial fibrillation in that setting. Discussed role of loop recorder for monitoring. Patient voiced understanding. Explained risks, benefits, and alternatives to ILR implantation, including but not limited to bleeding, infection, damage to nearby structures.  Pt verbalized understanding and wants to proceed. -Loop recorder implant today.   Fonda Kitty, MD 11/05/2023 5:52 PM

## 2023-11-04 NOTE — Plan of Care (Signed)
  Problem: Pain Managment: Goal: General experience of comfort will improve and/or be controlled Outcome: Progressing   Problem: Safety: Goal: Ability to remain free from injury will improve Outcome: Progressing   Problem: Skin Integrity: Goal: Risk for impaired skin integrity will decrease Outcome: Progressing

## 2023-11-04 NOTE — Hospital Course (Addendum)
 Corey Nielsen is a 78 y.o. male with history of HTN, HLD, type 2 diabetes mellitus with insulin  pump, diabetic neuropathy and retinopathy, BPH, migraines with aura, presenting with acute vision changes since Thursday.   Patient apparently had acute episode of visual changes with floating scintillating diamonds that covered his visual field with associated right-sided sharp then dull headache and some nausea.   Initially attributing his symptoms to migraines however he had never had visual changes like this in the past.  He was evaluated by ophthalmology outpatient and after evaluation, recommended to present to the ER for further workup. MRI brain was notable for acute infarct involving the right medial occipital lobe. Further workup commenced on admission.

## 2023-11-04 NOTE — Plan of Care (Signed)
  Problem: Education: Goal: Knowledge of disease or condition will improve Outcome: Progressing   Problem: Coping: Goal: Will identify appropriate support needs Outcome: Progressing   Problem: Health Behavior/Discharge Planning: Goal: Ability to manage health-related needs will improve Outcome: Progressing

## 2023-11-04 NOTE — Progress Notes (Signed)
 Progress Note    Corey Nielsen   FMW:[id number]  DOB: 12/23/1945  DOA: 11/03/2023     0 PCP: Dwight Trula SQUIBB, MD  Initial CC: decreased vision; HA  Hospital Course: Corey Nielsen is a 78 y.o. male with history of HTN, HLD, type 2 diabetes mellitus with insulin  pump, diabetic neuropathy and retinopathy, BPH, migraines with aura, presenting with acute vision changes since Thursday.   Patient apparently had acute episode of visual changes with floating scintillating diamonds that covered his visual field with associated right-sided sharp then dull headache and some nausea.   Initially attributing his symptoms to migraines however he had never had visual changes like this in the past.  He was evaluated by ophthalmology outpatient and after evaluation, recommended to present to the ER for further workup. MRI brain was notable for acute infarct involving the right medial occipital lobe. Further workup commenced on admission.  Interval History:  No changes in neurodeficits since admission.  Headache a little better. Still has decreased vision in his upper left visual fields.  Assessment and Plan: * Acute CVA (cerebrovascular accident) [facility]) - Patient developed headache but also had left upper visual field deficits.  Initially attributed his symptoms to complicated migraine but vision deficits continued to worsen and/or at least persist; evaluated by ophthalmology outpatient then presented to the ER - MRI brain showed acute nonhemorrhagic infarct involving right medial occipital lobe - CT angio head/neck negative for LVO or high-grade stenosis in the neck -Neurology following as well - Echo obtained 11/04/2023 and notable for positive bubble study.  EF 55 to 60%, no RWMA, mild LVH, grade 1 DD - PT/OT evals - DAPT x 21 days then mono asa per neuro - LDL 65; continued on Lipitor  - A1c 7%.  Continued on insulin  pump - loop recorder recommended by neuro as well; EP aware - will obtain LE  duplex in setting of CVA and PFO - await futher neuro rec's in case TEE and/or CTA chest still indicated  Essential hypertension - BP at goal.  No further permissive hypertension necessary  Insulin  dependent diabetes mellitus type IA (HCC) - A1c 7% on admission - Continue insulin  pump  BPH (benign prostatic hyperplasia) - Continue dutasteride  and Flomax   Hyperlipidemia - LDL 65 - Continue Lipitor   Old records reviewed in assessment of this patient  Antimicrobials:   DVT prophylaxis:  heparin injection 5,000 Units Start: 11/03/23 1730   Code Status:   Code Status: Full Code  Mobility Assessment (Last 72 Hours)     Mobility Assessment     Row Name 11/04/23 1709 11/04/23 9187         Does patient have an order for bedrest or is patient medically unstable -- No - Continue assessment      What is the highest level of mobility based on the progressive mobility assessment? Level 6 (Walks independently in room and hall) - Balance while walking in room without assist - Complete Level 5 (Walks with assist in room/hall) - Balance while stepping forward/back and can walk in room with assist - Complete         Barriers to discharge: None Disposition Plan: Home HH orders placed: N/A Status is: Observation  Objective: Blood pressure (!) 152/90, pulse 77, temperature 97.6 F (36.4 C), temperature source Oral, resp. rate 20, height 6' 5 (1.956 m), weight 106.1 kg, SpO2 100%.  Examination:  Physical Exam Constitutional:      General: He is not in acute distress.  Appearance: Normal appearance.  HENT:     Head: Normocephalic and atraumatic.     Mouth/Throat:     Mouth: Mucous membranes are moist.  Eyes:     Extraocular Movements: Extraocular movements intact.  Cardiovascular:     Rate and Rhythm: Normal rate and regular rhythm.  Pulmonary:     Effort: Pulmonary effort is normal. No respiratory distress.     Breath sounds: Normal breath sounds. No wheezing.   Abdominal:     General: Bowel sounds are normal. There is no distension.     Palpations: Abdomen is soft.     Tenderness: There is no abdominal tenderness.  Musculoskeletal:        General: Normal range of motion.     Cervical back: Normal range of motion and neck supple.  Skin:    General: Skin is warm and dry.  Neurological:     Mental Status: He is alert.     Comments: Decreased visual fields involving upper left quadrants  Psychiatric:        Mood and Affect: Mood normal.        Behavior: Behavior normal.      Consultants:  Neurology  Procedures:    Data Reviewed: Results for orders placed or performed during the hospital encounter of 11/03/23 (from the past 24 hours)  CBG monitoring, ED     Status: Abnormal   Collection Time: 11/03/23  8:35 PM  Result Value Ref Range   Glucose-Capillary 165 (H) 70 - 99 mg/dL  CBG monitoring, ED     Status: Abnormal   Collection Time: 11/03/23 10:28 PM  Result Value Ref Range   Glucose-Capillary 232 (H) 70 - 99 mg/dL   Comment 1 Document in Chart   CBG monitoring, ED     Status: Abnormal   Collection Time: 11/03/23 11:59 PM  Result Value Ref Range   Glucose-Capillary 205 (H) 70 - 99 mg/dL  CBG monitoring, ED     Status: Abnormal   Collection Time: 11/04/23  3:19 AM  Result Value Ref Range   Glucose-Capillary 211 (H) 70 - 99 mg/dL  Lipid panel     Status: None   Collection Time: 11/04/23  5:37 AM  Result Value Ref Range   Cholesterol 132 0 - 200 mg/dL   Triglycerides 92 <849 mg/dL   HDL 49 >59 mg/dL   Total CHOL/HDL Ratio 2.7 RATIO   VLDL 18 0 - 40 mg/dL   LDL Cholesterol 65 0 - 99 mg/dL  CBG monitoring, ED     Status: Abnormal   Collection Time: 11/04/23  7:36 AM  Result Value Ref Range   Glucose-Capillary 214 (H) 70 - 99 mg/dL  Glucose, capillary     Status: Abnormal   Collection Time: 11/04/23 11:56 AM  Result Value Ref Range   Glucose-Capillary 256 (H) 70 - 99 mg/dL  Glucose, capillary     Status: Abnormal    Collection Time: 11/04/23  4:37 PM  Result Value Ref Range   Glucose-Capillary 176 (H) 70 - 99 mg/dL    I have reviewed pertinent nursing notes, vitals, labs, and images as necessary. I have ordered labwork to follow up on as indicated.  I have reviewed the last notes from staff over past 24 hours. I have discussed patient's care plan and test results with nursing staff, CM/SW, and other staff as appropriate.  Time spent: Greater than 50% of the 55 minute visit was spent in counseling/coordination of care for the patient as laid  out in the A&P.   LOS: 0 days   Alm Apo, MD [facility] 11/04/2023, 5:56 PM

## 2023-11-04 NOTE — Evaluation (Signed)
 Physical Therapy Evaluation Patient Details Name: Corey Nielsen MRN: [id number] DOB: Dec 17, 1945 Today's Date: 11/04/2023  History of Present Illness  78 y.o. male presents to [facility] 11/03/23 with L sided vision loss. MRI showed R parietal occipital subacute infarct. PMHx: HTN, HLD, type 2 diabetes mellitus with insulin  pump, diabetic neuropathy and retinopathy, BPH, migraines with aura   Clinical Impression  PTA, pt was independent for mobility with no AD. Pt presents at functional mobility baseline with ability to stand and ambulate 230ft independently with no AD. Pt scored a 23/24 on the DGI indicating that pt is not at an increased risk of falling. Residual symptoms from CVA include seeing a smear in L>R peripheral vision. Pt has no further questions or concerns regarding mobility. He will have intermittent assistance from neighbor upon return home. No further acute or post acute PT needs identified. Acute PT to sign off. Please re-consult if new needs arise.        If plan is discharge home, recommend the following: Assist for transportation   Can travel by private vehicle    Yes    Equipment Recommendations None recommended by PT     Functional Status Assessment Patient has had a recent decline in their functional status and demonstrates the ability to make significant improvements in function in a reasonable and predictable amount of time.     Precautions / Restrictions Restrictions Weight Bearing Restrictions Per Provider Order: No      Mobility  Bed Mobility Overal bed mobility: Independent     Transfers Overall transfer level: Independent Equipment used: None     Ambulation/Gait Ambulation/Gait assistance: Independent Gait Distance (Feet): 250 Feet Assistive device: None Gait Pattern/deviations: WFL(Within Functional Limits)       Modified Rankin (Stroke Patients Only) Modified Rankin (Stroke Patients Only) Pre-Morbid Rankin Score: No symptoms Modified Rankin:  No significant disability     Balance Overall balance assessment: Independent, History of Falls      Standardized Balance Assessment Standardized Balance Assessment : Dynamic Gait Index   Dynamic Gait Index Level Surface: Normal Change in Gait Speed: Normal Gait with Horizontal Head Turns: Normal Gait with Vertical Head Turns: Normal Gait and Pivot Turn: Normal Step Over Obstacle: Mild Impairment Step Around Obstacles: Normal Steps: Normal Total Score: 23       Pertinent Vitals/Pain Pain Assessment Pain Assessment: No/denies pain    Home Living Family/patient expects to be discharged to:: Private residence Living Arrangements: Alone Available Help at Discharge: Neighbor;Available PRN/intermittently Type of Home: House Home Access: Level entry    Home Layout: One level Home Equipment: Agricultural consultant (2 wheels);Cane - single point;Shower seat;Grab bars - toilet;Grab bars - tub/shower      Prior Function Prior Level of Function : Independent/Modified Independent;Driving;Working/employed;History of Falls (last six months)        Mobility Comments: ModI with no AD. One prior fall due to tripping over vacuum cord       Extremity/Trunk Assessment   Upper Extremity Assessment Upper Extremity Assessment: Defer to OT evaluation    Lower Extremity Assessment Lower Extremity Assessment: Overall WFL for tasks assessed (hx of peripheral neuropathy R>L. Wears orthotic in R shoe due to collapsing arch)    Cervical / Trunk Assessment Cervical / Trunk Assessment: Normal  Communication   Communication Communication: No apparent difficulties    Cognition Arousal: Alert Behavior During Therapy: WFL for tasks assessed/performed   PT - Cognitive impairments: No apparent impairments      Following commands: Intact  Cueing Cueing Techniques: Verbal cues     General Comments General comments (skin integrity, edema, etc.): Reports smear in L>R peripheral  vision     PT Assessment Patient does not need any further PT services   AM-PAC PT 6 Clicks Mobility  Outcome Measure Help needed turning from your back to your side while in a flat bed without using bedrails?: None Help needed moving from lying on your back to sitting on the side of a flat bed without using bedrails?: None Help needed moving to and from a bed to a chair (including a wheelchair)?: None Help needed standing up from a chair using your arms (e.g., wheelchair or bedside chair)?: None Help needed to walk in hospital room?: None Help needed climbing 3-5 steps with a railing? : None 6 Click Score: 24    End of Session Equipment Utilized During Treatment: Gait belt Activity Tolerance: Patient tolerated treatment well Patient left: in bed;with call bell/phone within reach Nurse Communication: Mobility status PT Visit Diagnosis: Other abnormalities of gait and mobility (R26.89)    Time: 8354-8294 PT Time Calculation (min) (ACUTE ONLY): 20 min   Charges:   PT Evaluation $PT Eval Low Complexity: 1 Low   PT General Charges $$ ACUTE PT VISIT: 1 Visit       Kate ORN, PT, DPT Secure Chat Preferred  Rehab Office [phone]   Kate BRAVO Wendolyn 11/04/2023, 5:14 PM

## 2023-11-04 NOTE — Progress Notes (Signed)
 Pt arrived to 3w 37 from the ED. See assessment.

## 2023-11-04 NOTE — Progress Notes (Addendum)
 STROKE TEAM PROGRESS NOTE   INTERIM HISTORY/SUBJECTIVE Patient presented with 5-day history of left-sided vision loss and MRI scan confirms right parietal occipital subacute infarct.  CT angiogram shows no large vessel stenosis or occlusion.  Denies any prior history of atrial fibrillation or cardiac arrhythmias.  Loop recorder requested  Initially mistook his symptoms for a migraine  Echo pending  Still waiting on PT/OT  OBJECTIVE  CBC    Component Value Date/Time   WBC 7.0 11/03/2023 1040   RBC 4.25 11/03/2023 1040   HGB 13.6 11/03/2023 1040   HCT 39.4 11/03/2023 1040   PLT 212 11/03/2023 1040   MCV 92.7 11/03/2023 1040   MCH 32.0 11/03/2023 1040   MCHC 34.5 11/03/2023 1040   RDW 13.8 11/03/2023 1040   LYMPHSABS 2.0 11/03/2023 1040   MONOABS 0.6 11/03/2023 1040   EOSABS 0.1 11/03/2023 1040   BASOSABS 0.0 11/03/2023 1040    BMET    Component Value Date/Time   NA 138 11/03/2023 1040   NA 137 05/23/2022 1145   K 4.1 11/03/2023 1040   CL 105 11/03/2023 1040   CO2 24 11/03/2023 1040   GLUCOSE 165 (H) 11/03/2023 1040   BUN 26 (H) 11/03/2023 1040   BUN 20 05/23/2022 1145   CREATININE 1.01 11/03/2023 1040   CALCIUM  9.2 11/03/2023 1040   EGFR 66 05/23/2022 1145   GFRNONAA >60 11/03/2023 1040    IMAGING past 24 hours MR BRAIN WO CONTRAST Result Date: 11/03/2023 CLINICAL DATA:  Neuro deficit, acute, stroke suspected EXAM: MRI HEAD WITHOUT CONTRAST TECHNIQUE: Multiplanar, multiecho pulse sequences of the brain and surrounding structures were obtained without intravenous contrast. COMPARISON:  CT angiogram of the head dated November 03, 2023. FINDINGS: Brain: There is restricted diffusion present in the right medial occipital lobe compatible with an acute nonhemorrhagic infarct in the posterior cerebral artery distribution. There is increased T2 signal also present within the subcortical white matter and there is some hemosiderin staining. There is mild to moderate periventricular white  matter disease. 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 Vascular: Normal flow voids. Skull and upper cervical spine: Normal marrow signal. No lesions are evident. Sinuses/Orbits: Polypoid mucosal disease within the maxillary sinuses. The orbits are unremarkable. Mild mucosal disease within the mastoid air cells. Other: None. IMPRESSION: 1. Acute nonhemorrhagic infarct involving the right medial occipital lobe, possibly acute on chronic. 2. Mild to moderate periventricular white matter disease. Electronically Signed   By: Evalene Coho M.D.   On: 11/03/2023 16:16   CT ANGIO HEAD NECK W WO CM Result Date: 11/03/2023 CLINICAL DATA:  Neuro deficit, concern for stroke, bilateral temporal visual field deficit. Headache and vision changes in the left eye. EXAM: CT ANGIOGRAPHY HEAD AND NECK WITH AND WITHOUT CONTRAST TECHNIQUE: Multidetector CT imaging of the head and neck was performed using the standard protocol during bolus administration of intravenous contrast. Multiplanar CT image reconstructions and MIPs were obtained to evaluate the vascular anatomy. Carotid stenosis measurements (when applicable) are obtained utilizing NASCET criteria, using the distal internal carotid diameter as the denominator. RADIATION DOSE REDUCTION: This exam was performed according to the departmental dose-optimization program which includes automated exposure control, adjustment of the mA and/or kV according to patient size and/or use of iterative reconstruction technique. CONTRAST:  75mL OMNIPAQUE  IOHEXOL  350 MG/ML SOLN COMPARISON:  None Available. FINDINGS: CT HEAD FINDINGS Brain: No acute intracranial hemorrhage. No CT evidence of acute infarct. Nonspecific hypoattenuation in the periventricular and subcortical white matter favored to reflect chronic  microvascular ischemic changes. No edema, mass effect, or midline shift. The basilar cisterns are patent. Retro cerebellar fluid with intact cerebellar vermis  suggestive of mega cisterna magna. Ventricles: Ventricles are normal in size and configuration. Vascular: No hyperdense vessel. Skull: No acute or aggressive finding. Sinuses/orbits: Orbits are symmetric. Mucosal thickening and mucous retention cysts in the bilateral maxillary sinuses and left sphenoid sinus. Other: Mastoid air cells are clear. CTA NECK FINDINGS Aortic arch: Standard configuration of the aortic arch. Imaged portion shows no evidence of aneurysm or dissection. No significant stenosis of the major arch vessel origins. Pulmonary arteries: As permitted by contrast timing, there are no filling defects in the visualized pulmonary arteries. Subclavian arteries: The subclavian arteries are patent bilaterally. Right carotid system: No evidence of dissection, stenosis (50% or greater), or occlusion. Mild atherosclerosis at the carotid bifurcation. Left carotid system: No evidence of dissection, stenosis (50% or greater), or occlusion. Vertebral arteries: Codominant. No evidence of dissection, stenosis (50% or greater), or occlusion. Skeleton: No acute findings. Degenerative changes in the cervical spine. Disc space narrowing greatest at C5-6 and C6-7. Anterior endplate osteophytes at multiple levels in the cervical spine. Other neck: The visualized airway is patent. No cervical lymphadenopathy. Upper chest: No acute findings. Review of the MIP images confirms the above findings CTA HEAD FINDINGS ANTERIOR CIRCULATION: The intracranial ICAs are patent bilaterally. Mild atherosclerosis of the right carotid siphon. No significant stenosis, proximal occlusion, aneurysm, or vascular malformation. MCAs: The middle cerebral arteries are patent bilaterally. ACAs: The anterior cerebral arteries are patent bilaterally. POSTERIOR CIRCULATION: No significant stenosis, proximal occlusion, aneurysm, or vascular malformation. PCAs: The posterior cerebral arteries are patent bilaterally. Pcomm: Not well visualized. SCAs: The  superior cerebellar arteries are patent bilaterally. Basilar artery: Patent AICAs: Not well visualized. PICAs: Patent Vertebral arteries: The intracranial vertebral arteries are patent. Venous sinuses: As permitted by contrast timing, patent. Anatomic variants: None Review of the MIP images confirms the above findings IMPRESSION: No large vessel occlusion. No high-grade stenosis, aneurysm, or dissection of the arteries in the head and neck. No CT evidence of acute intracranial abnormality. Mild chronic microvascular ischemic changes. Electronically Signed   By: Donnice Mania M.D.   On: 11/03/2023 13:39    Vitals:   11/03/23 1507 11/03/23 1751 11/04/23 0437 11/04/23 0600  BP: (!) 154/76  (!) 155/76 (!) 142/78  Pulse: 84  69 73  Resp: 18  20   Temp: 98.6 F (37 C)  97.9 F (36.6 C)   TempSrc:   Oral   SpO2: 97%  98% 97%  Weight:  106.1 kg    Height:  6' 5 (1.956 m)       PHYSICAL EXAM General:  Alert, well-nourished, well-developed patient in no acute distress Psych:  Mood and affect appropriate for situation CV: Regular rate and rhythm on monitor Respiratory:  Regular, unlabored respirations on room air GI: Abdomen soft and nontender   NEURO:  Mental Status: AA&Ox3, patient is able to give clear and coherent history Speech/Language: speech is without dysarthria or aphasia.  Naming, repetition, fluency, and comprehension intact.  Cranial Nerves:  II: PERRL. Left dense homonymous hemianopsia III, IV, VI: EOMI. Eyelids elevate symmetrically.  V: Sensation is intact to light touch and symmetrical to face.  VII: Face is symmetrical resting and smiling VIII: hearing intact to voice. IX, X: Palate elevates symmetrically. Phonation is normal.  KP:Dynloizm shrug 5/5. XII: tongue is midline without fasciculations. Motor: 5/5 strength to all muscle groups tested.  Tone: is normal  and bulk is normal Sensation- Intact to light touch bilaterally. Extinction absent to light touch to DSS.    Coordination: FTN intact bilaterally, HKS: no ataxia in BLE.No drift.  Gait- deferred  Most Recent NIH 2     ASSESSMENT/PLAN  Mr. Corey Nielsen is a 78 y.o. male with history of HTN, HLD, type 2 diabetes mellitus with insulin  pump, diabetic neuropathy and retinopathy, BPH, migraines with aura, presenting with acute vision changes since Thursday.  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 2D Echo pending  LDL 65 HgbA1c 7.0 Loop recorder- requested VTE prophylaxis - heparin  SQ No antithrombotic prior to admission, now on aspirin  81 mg daily and clopidogrel  75 mg daily for 3 weeks and then aspirin  alone. Therapy recommendations:  Pending Disposition:  pending   Hx of Stroke/TIA Right medial occipital lobe ischemic infarct on imaging   Hypertension Home meds:  none Stable Blood Pressure Goal: SBP less than 160   Hyperlipidemia Home meds:  Lipitor  20mg , resumed in hospital LDL 65, goal < 70 Continue statin at discharge  Diabetes type II UnControlled Home meds:  metformin , actos , ozempic  HgbA1c 7.0, goal < 7.0 CBGs SSI Recommend close follow-up with PCP for better DM control  Other Stroke Risk Factors ETOH use,  advised to drink no more than 2 drink(s) a day Headaches/migraines  Other Active Problems BPH  Hospital day # 0  Patient seen and examined by NP/APP with MD. MD to update note as needed.   Jorene Last, DNP, FNP-BC [facility] Pager: [phone]  I have personally obtained history,examined this patient, reviewed notes, independently viewed imaging studies, participated in medical decision making and plan of care.ROS completed by me personally and pertinent positives fully documented  I have made any additions or clarifications  directly to the above note. Agree with note above.  Patient presented with 45-day history of left-sided vision loss which initially attributed to migraine but MRI confirms right parietal occipital infarct likely cryptogenic etiology.  On cardiac monitor for paroxysmal A-fib at discharge likely need loop recorder.  Continue ongoing stroke workup.  Recommend dual antiplatelet therapy for 3 weeks followed by aspirin  alone and aggressive risk factor modification.  Patient advised not to drive till his peripheral vision loss improves.    I personally spent a total of 50 minutes in the care of the patient today including getting/reviewing separately obtained history, performing a medically appropriate exam/evaluation, counseling and educating, placing orders, referring and communicating with other health care professionals, documenting clinical information in the EHR, independently interpreting results, and coordinating care.        Eather Popp, MD Medical Director [facility] Pager: [phone] 11/04/2023 4:33 PM    To contact Stroke Continuity provider, please refer to [email or web address]. After hours, contact General Neurology

## 2023-11-04 NOTE — Assessment & Plan Note (Addendum)
-   Patient developed headache but also had left upper visual field deficits.  Initially attributed his symptoms to complicated migraine but vision deficits continued to worsen and/or at least persist; evaluated by ophthalmology outpatient then presented to the ER - MRI brain showed acute nonhemorrhagic infarct involving right medial occipital lobe - CT angio head/neck negative for LVO or high-grade stenosis in the neck -Neurology following as well - Echo obtained 11/04/2023 and notable for positive bubble study.  EF 55 to 60%, no RWMA, mild LVH, grade 1 DD - PT/OT evals - DAPT x 21 days then mono asa per neuro - LDL 65; continued on Lipitor  - A1c 7%.  Continued on insulin  pump - loop recorder recommended by neuro as well; EP aware; placed 11/05/23 prior to d/c - LE duplex negative for DVT

## 2023-11-04 NOTE — Assessment & Plan Note (Signed)
-   BP at goal.  No further permissive hypertension necessary

## 2023-11-04 NOTE — Assessment & Plan Note (Signed)
-   A1c 7% on admission - Continue insulin  pump

## 2023-11-04 NOTE — Consult Note (Incomplete)
 ELECTROPHYSIOLOGY CONSULT NOTE  Patient ID: Corey Nielsen MRN: [id number], DOB/AGE: 79-Jul-1947   Admit date: 11/03/2023 Date of Consult: 11/04/2023  Primary Physician: Dwight Trula SQUIBB, MD Primary Cardiologist: Dr. Court Reason for Consultation: Cryptogenic stroke ; recommendations regarding Implantable Loop Recorder, requested by Dr. Rosemarie  History of Present Illness Corey Nielsen was admitted on 11/03/2023 with persistent visual changes, headache (several days, initially suspected a migraine) > eventually sought attention at his eye MD, with reportedly a normal exam > went to [facility] though ultimately left 2/2 long wait time > [facility]bridge left and came to [facility] via personal vehicle and found to have acute/subacute stroke    PMHx includes: Migraine HAs, HLD, DM (w/neuropathy), HTN, coronary CTA 05/27/2022 revealing a coronary calcium  score of 470 majority which was in the LAD which was FFR negative for significance   Neurology notes:  Acute on chronic right medial occipital lobe Etiology:  concern for embolic.  he has undergone workup for stroke including echocardiogram and carotid dopplers.  The patient has been monitored on telemetry which has demonstrated sinus rhythm with no arrhythmias.    Neurology has deferred TEE Echocardiogram this admission demonstrated ***.     Lab work is reviewed. VSS (hypertension noted-deferred to attending team), afebrile  Prior to admission, the patient denies chest pain, shortness of breath, dizziness, palpitations, or syncope.  They are recovering from their stroke with plans to *** at discharge.      Past Medical History:  Diagnosis Date   BENIGN PROSTATIC HYPERTROPHY 02/05/2007   DIABETES MELLITUS, TYPE II 02/05/2007   Type 1   Headache    migraine occasionally   History of swelling of feet    HYPERLIPIDEMIA 02/05/2007   Hypertension      Surgical History:  Past Surgical History:  Procedure Laterality Date   COLONOSCOPY WITH PROPOFOL  N/A  05/29/2014   Procedure: COLONOSCOPY WITH PROPOFOL ;  Surgeon: Gladis MARLA Louder, MD;  Location: [facility];  Service: Endoscopy;  Laterality: N/A;   KIDNEY STONE SURGERY  2007   NASAL SINUS SURGERY  1989   obstructing growth ( Dr.  Mitzie)   UMBILICAL HERNIA REPAIR  1996   Dr Nicholaus     Facility-Administered Medications Prior to Admission  Medication Dose Route Frequency Provider Last Rate Last Admin   triamcinolone  acetonide (KENALOG ) 10 MG/ML injection 10 mg  10 mg Other Once Regal, Norman S, DPM       Medications Prior to Admission  Medication Sig Dispense Refill Last Dose/Taking   atorvastatin  (LIPITOR) 20 MG tablet TAKE 1 TABLET EACH DAY (Patient taking differently: Take 20 mg by mouth daily.) 90 tablet 3 11/03/2023 Morning   Coenzyme Q10 (CO Q 10 PO) Take 300 mg by mouth daily.   11/03/2023 Morning   cyanocobalamin  100 MCG tablet Take 100 mcg by mouth daily.   11/03/2023 Morning   dutasteride  (AVODART ) 0.5 MG capsule Take 0.5 mg by mouth daily.   11/03/2023 Morning   ibuprofen (ADVIL) 800 MG tablet Take 800 mg by mouth every 6 (six) hours as needed for headache.   Past Week   Insulin  Human (INSULIN  PUMP) 100 unit/ml SOLN Inject into the skin. Insulin  pump with novolog  insulin . Average glucose usually around 179 daily per patient   11/03/2023 Morning   metFORMIN  (GLUCOPHAGE -XR) 500 MG 24 hr tablet Take 500 mg by mouth in the morning and at bedtime.   11/03/2023 Morning   Multiple Vitamins-Minerals (MULTIVITAMIN ADULTS PO) Take 1 tablet by mouth daily.  11/03/2023 Morning   mupirocin ointment (BACTROBAN) 2 % Apply 1 Application topically 2 (two) times daily.   11/03/2023 Morning   pioglitazone  (ACTOS ) 30 MG tablet Take 1 tablet (30 mg total) by mouth daily. 90 tablet 3 11/03/2023 Morning   tamsulosin  (FLOMAX ) 0.4 MG CAPS capsule Take 0.4 mg by mouth every 12 (twelve) hours.   11/03/2023 Morning   VASCEPA 1 g capsule TAKE 2 CAPSULES BY MOUTH TWICE DAILY WITH MEALS FOR TRIGLYCERIDES   11/03/2023 Morning    ONETOUCH DELICA LANCETS 33G MISC U UTD TO TEST BLOOD SUGARS 8 TIMES A DAY  11    ONETOUCH VERIO test strip U TO TEST EIGHT TIMES D UTD  11    OZEMPIC, 0.25 OR 0.5 MG/DOSE, 2 MG/3ML SOPN Inject into the skin once a week. (Patient not taking: Reported on 11/03/2023)   Not Taking    Inpatient Medications:   aspirin EC  81 mg Oral Daily   atorvastatin   80 mg Oral Daily   clopidogrel  75 mg Oral Daily   dutasteride   0.5 mg Oral Daily   And   tamsulosin   0.4 mg Oral Daily   heparin  5,000 Units Subcutaneous Q8H   insulin  aspart  0-15 Units Subcutaneous TID WC   insulin  aspart  0-5 Units Subcutaneous QHS   insulin  pump   Subcutaneous TID WC, HS, 0200   sodium chloride  flush  3-10 mL Intravenous Q12H    Allergies:  Allergies  Allergen Reactions   Tetanus Toxoids Shortness Of Breath    Horse serum   Amoxicillin    Tetanus Immune Globulin Anxiety    Other Reaction(s): Angioedema    Social History   Socioeconomic History   Marital status: Widowed    Spouse name: Not on file   Number of children: Not on file   Years of education: Not on file   Highest education level: Not on file  Occupational History   Not on file  Tobacco Use   Smoking status: Former    Current packs/day: 0.00    Average packs/day: 0.5 packs/day for 25.0 years (12.5 ttl pk-yrs)    Types: Cigarettes    Start date: 05/05/1961    Quit date: 05/05/1986    Years since quitting: 37.5   Smokeless tobacco: Never  Substance and Sexual Activity   Alcohol use: Yes    Comment: rare   Drug use: No   Sexual activity: Not on file  Other Topics Concern   Not on file  Social History Narrative   Not on file   Social Drivers of Health   Financial Resource Strain: Not on file  Food Insecurity: No Food Insecurity (11/04/2023)   Hunger Vital Sign    Worried About Running Out of Food in the Last Year: Never true    Ran Out of Food in the Last Year: Never true  Transportation Needs: No Transportation Needs (11/04/2023)    PRAPARE - Administrator, Civil Service (Medical): No    Lack of Transportation (Non-Medical): No  Physical Activity: Not on file  Stress: Not on file  Social Connections: Socially Isolated (11/04/2023)   Social Connection and Isolation Panel    Frequency of Communication with Friends and Family: More than three times a week    Frequency of Social Gatherings with Friends and Family: More than three times a week    Attends Religious Services: Never    Database administrator or Organizations: No    Attends Club or  Organization Meetings: Never    Marital Status: Widowed  Intimate Partner Violence: Not At Risk (11/04/2023)   Humiliation, Afraid, Rape, and Kick questionnaire    Fear of Current or Ex-Partner: No    Emotionally Abused: No    Physically Abused: No    Sexually Abused: No     Family History  Problem Relation Age of Onset   Diabetes Mother    Heart disease Father    Early death Brother       Review of Systems: All other systems reviewed and are otherwise negative except as noted above.  Physical Exam: Vitals:   11/03/23 1751 11/04/23 0437 11/04/23 0600 11/04/23 0812  BP:  (!) 155/76 (!) 142/78 (!) 152/92  Pulse:  69 73 74  Resp:  20  17  Temp:  97.9 F (36.6 C)  97.9 F (36.6 C)  TempSrc:  Oral  Oral  SpO2:  98% 97% 100%  Weight: 106.1 kg     Height: 6' 5 (1.956 m)   6' 5 (1.956 m)   *** GEN- The patient is well appearing, alert and oriented x 3 today.   Head- normocephalic, atraumatic Eyes-  Sclera clear, conjunctiva pink Ears- hearing intact Oropharynx- clear Neck- supple Lungs- *** CTA b/l, normal work of breathing Heart- *** RRR, no murmurs, rubs or gallops  GI- soft, NT, ND Extremities- no clubbing, cyanosis, or edema MS- no significant deformity or atrophy Skin- no rash or lesion Psych- euthymic mood, full affect   Labs:   Lab Results  Component Value Date   WBC 7.0 11/03/2023   HGB 13.6 11/03/2023   HCT 39.4 11/03/2023   MCV  92.7 11/03/2023   PLT 212 11/03/2023    Recent Labs  Lab 11/03/23 1040  NA 138  K 4.1  CL 105  CO2 24  BUN 26*  CREATININE 1.01  CALCIUM  9.2  PROT 6.2*  BILITOT 0.9  ALKPHOS 76  ALT 15  AST 20  GLUCOSE 165*   No results found for: CKTOTAL, CKMB, CKMBINDEX, TROPONINI Lab Results  Component Value Date   CHOL 132 11/04/2023   CHOL 129 10/16/2011   CHOL 125 02/13/2010   Lab Results  Component Value Date   HDL 49 11/04/2023   HDL 48.10 10/16/2011   HDL 33.70 (L) 02/13/2010   Lab Results  Component Value Date   LDLCALC 65 11/04/2023   LDLCALC 62 10/16/2011   LDLCALC 66 02/13/2010   Lab Results  Component Value Date   TRIG 92 11/04/2023   TRIG 93.0 10/16/2011   TRIG 129.0 02/13/2010   Lab Results  Component Value Date   CHOLHDL 2.7 11/04/2023   CHOLHDL 3 10/16/2011   CHOLHDL 4 02/13/2010   No results found for: LDLDIRECT  No results found for: DDIMER   Radiology/Studies:   MR BRAIN WO CONTRAST Result Date: 11/03/2023 CLINICAL DATA:  Neuro deficit, acute, stroke suspected EXAM: MRI HEAD WITHOUT CONTRAST TECHNIQUE: Multiplanar, multiecho pulse sequences of the brain and surrounding structures were obtained without intravenous contrast. COMPARISON:  CT angiogram of the head dated November 03, 2023. FINDINGS: Brain: There is restricted diffusion present in the right medial occipital lobe compatible with an acute nonhemorrhagic infarct in the posterior cerebral artery distribution. There is increased T2 signal also present within the subcortical white matter and there is some hemosiderin staining. There is mild to moderate periventricular white matter disease. 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 Vascular: Normal flow voids. Skull and upper cervical spine: Normal  marrow signal. No lesions are evident. Sinuses/Orbits: Polypoid mucosal disease within the maxillary sinuses. The orbits are unremarkable. Mild mucosal disease within the mastoid air  cells. Other: None. IMPRESSION: 1. Acute nonhemorrhagic infarct involving the right medial occipital lobe, possibly acute on chronic. 2. Mild to moderate periventricular white matter disease. Electronically Signed   By: Evalene Coho M.D.   On: 11/03/2023 16:16   CT ANGIO HEAD NECK W WO CM Result Date: 11/03/2023 CLINICAL DATA:  Neuro deficit, concern for stroke, bilateral temporal visual field deficit. Headache and vision changes in the left eye. EXAM: CT ANGIOGRAPHY HEAD AND NECK WITH AND WITHOUT CONTRAST TECHNIQUE: Multidetector CT imaging of the head and neck was performed using the standard protocol during bolus administration of intravenous contrast. Multiplanar CT image reconstructions and MIPs were obtained to evaluate the vascular anatomy. Carotid stenosis measurements (when applicable) are obtained utilizing NASCET criteria, using the distal internal carotid diameter as the denominator. RADIATION DOSE REDUCTION: This exam was performed according to the departmental dose-optimization program which includes automated exposure control, adjustment of the mA and/or kV according to patient size and/or use of iterative reconstruction technique. CONTRAST:  75mL OMNIPAQUE  IOHEXOL  350 MG/ML SOLN COMPARISON:  None Available. FINDINGS: CT HEAD FINDINGS Brain: No acute intracranial hemorrhage. No CT evidence of acute infarct. Nonspecific hypoattenuation in the periventricular and subcortical white matter favored to reflect chronic microvascular ischemic changes. No edema, mass effect, or midline shift. The basilar cisterns are patent. Retro cerebellar fluid with intact cerebellar vermis suggestive of mega cisterna magna. Ventricles: Ventricles are normal in size and configuration. Vascular: No hyperdense vessel. Skull: No acute or aggressive finding. Sinuses/orbits: Orbits are symmetric. Mucosal thickening and mucous retention cysts in the bilateral maxillary sinuses and left sphenoid sinus. Other: Mastoid air  cells are clear. CTA NECK FINDINGS Aortic arch: Standard configuration of the aortic arch. Imaged portion shows no evidence of aneurysm or dissection. No significant stenosis of the major arch vessel origins. Pulmonary arteries: As permitted by contrast timing, there are no filling defects in the visualized pulmonary arteries. Subclavian arteries: The subclavian arteries are patent bilaterally. Right carotid system: No evidence of dissection, stenosis (50% or greater), or occlusion. Mild atherosclerosis at the carotid bifurcation. Left carotid system: No evidence of dissection, stenosis (50% or greater), or occlusion. Vertebral arteries: Codominant. No evidence of dissection, stenosis (50% or greater), or occlusion. Skeleton: No acute findings. Degenerative changes in the cervical spine. Disc space narrowing greatest at C5-6 and C6-7. Anterior endplate osteophytes at multiple levels in the cervical spine. Other neck: The visualized airway is patent. No cervical lymphadenopathy. Upper chest: No acute findings. Review of the MIP images confirms the above findings CTA HEAD FINDINGS ANTERIOR CIRCULATION: The intracranial ICAs are patent bilaterally. Mild atherosclerosis of the right carotid siphon. No significant stenosis, proximal occlusion, aneurysm, or vascular malformation. MCAs: The middle cerebral arteries are patent bilaterally. ACAs: The anterior cerebral arteries are patent bilaterally. POSTERIOR CIRCULATION: No significant stenosis, proximal occlusion, aneurysm, or vascular malformation. PCAs: The posterior cerebral arteries are patent bilaterally. Pcomm: Not well visualized. SCAs: The superior cerebellar arteries are patent bilaterally. Basilar artery: Patent AICAs: Not well visualized. PICAs: Patent Vertebral arteries: The intracranial vertebral arteries are patent. Venous sinuses: As permitted by contrast timing, patent. Anatomic variants: None Review of the MIP images confirms the above findings  IMPRESSION: No large vessel occlusion. No high-grade stenosis, aneurysm, or dissection of the arteries in the head and neck. No CT evidence of acute intracranial abnormality. Mild chronic microvascular ischemic  changes. Electronically Signed   By: Donnice Mania M.D.   On: 11/03/2023 13:39    12-lead ECG none this admission noted in Epic or his physical chart All EKG's in EPIC reviewed with no documented atrial fibrillation  Telemetry SR, rare PVC  Assessment and Plan:  1. Cryptogenic stroke The patient presents with cryptogenic stroke. ***  I spoke at length with the patient about monitoring for afib with either a 30 day event monitor or an implantable loop recorder.  Risks, benefits, and alteratives to implantable loop recorder were discussed with the patient today.   At this time, the patient is very clear in their decision *** to proceed with implantable loop recorder.   *** Wound care was reviewed with the patient (keep incision clean and dry for 3 days).  Wound check follow up will be scheduled for the patient.  Please call with questions.   Renee Macario Arthur, PA-C 11/04/2023

## 2023-11-05 ENCOUNTER — Encounter (HOSPITAL_COMMUNITY)
Admission: EM | Disposition: A | Discharge status: Home / Self Care | Payer: Self-pay | Source: Ambulatory Visit | Attending: Emergency Medicine

## 2023-11-05 ENCOUNTER — Other Ambulatory Visit (HOSPITAL_COMMUNITY): Payer: Self-pay

## 2023-11-05 ENCOUNTER — Observation Stay (HOSPITAL_COMMUNITY)

## 2023-11-05 DIAGNOSIS — R29702 NIHSS score 2: Secondary | ICD-10-CM

## 2023-11-05 DIAGNOSIS — Q2112 Patent foramen ovale: Secondary | ICD-10-CM | POA: Diagnosis not present

## 2023-11-05 DIAGNOSIS — Z7985 Long-term (current) use of injectable non-insulin antidiabetic drugs: Secondary | ICD-10-CM

## 2023-11-05 DIAGNOSIS — I639 Cerebral infarction, unspecified: Secondary | ICD-10-CM | POA: Diagnosis not present

## 2023-11-05 DIAGNOSIS — E1165 Type 2 diabetes mellitus with hyperglycemia: Secondary | ICD-10-CM | POA: Diagnosis not present

## 2023-11-05 DIAGNOSIS — Z7984 Long term (current) use of oral hypoglycemic drugs: Secondary | ICD-10-CM | POA: Diagnosis not present

## 2023-11-05 DIAGNOSIS — E785 Hyperlipidemia, unspecified: Secondary | ICD-10-CM | POA: Diagnosis not present

## 2023-11-05 DIAGNOSIS — I10 Essential (primary) hypertension: Secondary | ICD-10-CM | POA: Diagnosis not present

## 2023-11-05 DIAGNOSIS — I6340 Cerebral infarction due to embolism of unspecified cerebral artery: Secondary | ICD-10-CM | POA: Diagnosis not present

## 2023-11-05 HISTORY — PX: LOOP RECORDER INSERTION: EP1214

## 2023-11-05 LAB — GLUCOSE, CAPILLARY
Glucose-Capillary: 244 mg/dL — ABNORMAL HIGH (ref 70–99)
Glucose-Capillary: 283 mg/dL — ABNORMAL HIGH (ref 70–99)
Glucose-Capillary: 234 mg/dL — ABNORMAL HIGH (ref 70–99)

## 2023-11-05 SURGERY — LOOP RECORDER INSERTION

## 2023-11-05 MED ORDER — ASPIRIN 81 MG PO TBEC
81.0000 mg | DELAYED_RELEASE_TABLET | Freq: Every day | ORAL | Status: AC
Start: 2023-11-06 — End: ?

## 2023-11-05 MED ORDER — LIDOCAINE-EPINEPHRINE 1 %-1:100000 IJ SOLN
INTRAMUSCULAR | Status: AC
  Filled 2023-11-05: qty 1

## 2023-11-05 MED ORDER — LIDOCAINE-EPINEPHRINE 1 %-1:100000 IJ SOLN
INTRAMUSCULAR | Status: DC | PRN
  Administered 2023-11-05 (×2): 20 mL

## 2023-11-05 MED ORDER — CLOPIDOGREL BISULFATE 75 MG PO TABS
75.0000 mg | ORAL_TABLET | Freq: Every day | ORAL | 0 refills | Status: AC
  Filled 2023-11-05: qty 18, 18d supply, fill #0

## 2023-11-05 MED ORDER — ATORVASTATIN CALCIUM 80 MG PO TABS
80.0000 mg | ORAL_TABLET | Freq: Every day | ORAL | 2 refills | Status: AC
  Filled 2023-11-05 – 2023-11-25 (×2): qty 90, 90d supply, fill #0
  Filled 2024-01-11: qty 90, 90d supply, fill #1

## 2023-11-05 SURGICAL SUPPLY — 2 items
MONITOR REVEAL LINQ II (Prosthesis & Implant Heart) IMPLANT
PACK LOOP INSERTION (CUSTOM PROCEDURE TRAY) ×2 IMPLANT

## 2023-11-05 NOTE — Evaluation (Signed)
 Occupational Therapy Evaluation Patient Details Name: Corey Nielsen MRN: [id number] DOB: [birth year] Today's Date: 11/05/2023   History of Present Illness   78 y.o. male presents to [facility]/03/23 with L sided vision loss. MRI showed R parietal occipital subacute infarct. PMHx: HTN, HLD, type 2 diabetes mellitus with insulin  pump, diabetic neuropathy and retinopathy, BPH, migraines with aura     Clinical Impressions Pt admitted for above, PTA pt reports being ind with ADLs/iADLs.Pt currently presenting with visual field deficits within portions of the LLQ/LUQ of bilat eyes. He demonstrated good understanding of visual scanning strategies, able to complete Adls with supervision to mod I level. OT to continue following pt while in acute stay to further challenge visual deficits functionally. Recommend pt receive Outpt Neuro OT services for vision and Neuro Ophthalmologist.      If plan is discharge home, recommend the following:   Assist for transportation     Functional Status Assessment   Patient has had a recent decline in their functional status and/or demonstrates limited ability to make significant improvements in function in a reasonable and predictable amount of time     Equipment Recommendations   None recommended by OT     Recommendations for Other Services         Precautions/Restrictions   Precautions Precautions: Fall (low fall risk) Restrictions Weight Bearing Restrictions Per Provider Order: No     Mobility Bed Mobility               General bed mobility comments: in chair on arrival    Transfers Overall transfer level: Independent Equipment used: None                      Balance Overall balance assessment: Independent                                         ADL either performed or assessed with clinical judgement   ADL Overall ADL's : Needs assistance/impaired Eating/Feeding: Independent;Sitting   Grooming:  Standing;Supervision/safety   Upper Body Bathing: Modified independent;Standing   Lower Body Bathing: Sit to/from stand;Supervison/ safety   Upper Body Dressing : Modified independent;Sitting   Lower Body Dressing: Supervision/safety;Sit to/from stand       Toileting- Architect and Hygiene: Supervision/safety;Sit to/from stand       Functional mobility during ADLs: Supervision/safety General ADL Comments: Educated pt on visual scanning strategies and fall prevention. He reports already using head/eye movements until objects return to within his FOV     Vision Baseline Vision/History: 1 Wears glasses Ability to See in Adequate Light: 1 Impaired Patient Visual Report: Central vision impairment Vision Assessment?: Yes Eye Alignment: Within Functional Limits Ocular Range of Motion: Within Functional Limits Alignment/Gaze Preference: Within Defined Limits Tracking/Visual Pursuits: Able to track stimulus in all quads without difficulty Saccades: Within functional limits Convergence: Within functional limits Visual Fields: Other (comment) (Pt states that he sees s shaped squiggles around the region of his LUQ and some of LLQ in bilat eyes.) Diplopia Assessment:  (n/a) Additional Comments: During confrontation testing pt noted that he sees all therapists fingers held up in the peripherals. It is mainly a deficit when things are held at eye level just off to the side. See visual field comment for more details.     Perception Perception: Within Functional Limits       Praxis Praxis: [facility]  Pertinent Vitals/Pain Pain Assessment Pain Assessment: No/denies pain     Extremity/Trunk Assessment Upper Extremity Assessment Upper Extremity Assessment: Overall WFL for tasks assessed   Lower Extremity Assessment Lower Extremity Assessment: Overall WFL for tasks assessed (hx of peripheral neuropathy R>L. Wears orthotic in R shoe due to collapsing arch)   Cervical /  Trunk Assessment Cervical / Trunk Assessment: Normal   Communication Communication Communication: No apparent difficulties   Cognition Arousal: Alert Behavior During Therapy: WFL for tasks assessed/performed Cognition: No apparent impairments                               Following commands: Intact       Cueing  General Comments   Cueing Techniques: Verbal cues      Exercises     Shoulder Instructions      Home Living Family/patient expects to be discharged to:: Private residence Living Arrangements: Alone Available Help at Discharge: Neighbor;Available PRN/intermittently Type of Home: House Home Access: Level entry     Home Layout: One level     Bathroom Shower/Tub: Producer, television/film/video: Standard Bathroom Accessibility: Yes   Home Equipment: Agricultural consultant (2 wheels);Cane - single point;Shower seat;Grab bars - toilet;Grab bars - tub/shower          Prior Functioning/Environment Prior Level of Function : Independent/Modified Independent;Driving;Working/employed;History of Falls (last six months)             Mobility Comments: ModI with no AD. One prior fall due to tripping over vacuum cord ADLs Comments: ind, teaches history.    OT Problem List: Impaired vision/perception   OT Treatment/Interventions: Visual/perceptual remediation/compensation;Therapeutic activities      OT Goals(Current goals can be found in the care plan section)   Acute Rehab OT Goals Patient Stated Goal: to go home OT Goal Formulation: With patient Time For Goal Achievement: 11/19/23 Potential to Achieve Goals: Good ADL Goals Additional ADL Goal #1: Pt will demonstrate ind with visual scanning strategies.   OT Frequency:  Min 1X/week    Co-evaluation              AM-PAC OT 6 Clicks Daily Activity     Outcome Measure Help from another person eating meals?: None Help from another person taking care of personal grooming?: A  Little Help from another person toileting, which includes using toliet, bedpan, or urinal?: A Little Help from another person bathing (including washing, rinsing, drying)?: A Little Help from another person to put on and taking off regular upper body clothing?: None Help from another person to put on and taking off regular lower body clothing?: A Little 6 Click Score: 20   End of Session Equipment Utilized During Treatment: Gait belt Nurse Communication: Mobility status  Activity Tolerance: Patient tolerated treatment well Patient left: in chair;with call bell/phone within reach  OT Visit Diagnosis: Low vision, both eyes (H54.2)                Time: 8945-8883 OT Time Calculation (min): 22 min Charges:  OT General Charges $OT Visit: 1 Visit OT Treatments $Therapeutic Activity: 8-22 mins  11/05/2023  AB, OTR/L  Acute Rehabilitation Services  Office: [phone]   Curtistine JONETTA Das 11/05/2023, 12:54 PM

## 2023-11-05 NOTE — Progress Notes (Signed)
 STROKE TEAM PROGRESS NOTE   INTERIM HISTORY/SUBJECTIVE Patient is sitting up in bed comfortably.  Is doing well.  No complaints. Echocardiogram shows EF 55 to 60%.  Left atrial size normal.  Positive bubble study.  Lower extremity venous Dopplers are pending Loop recorder pending OBJECTIVE  CBC    Component Value Date/Time   WBC 7.0 11/03/2023 1040   RBC 4.25 11/03/2023 1040   HGB 13.6 11/03/2023 1040   HCT 39.4 11/03/2023 1040   PLT 212 11/03/2023 1040   MCV 92.7 11/03/2023 1040   MCH 32.0 11/03/2023 1040   MCHC 34.5 11/03/2023 1040   RDW 13.8 11/03/2023 1040   LYMPHSABS 2.0 11/03/2023 1040   MONOABS 0.6 11/03/2023 1040   EOSABS 0.1 11/03/2023 1040   BASOSABS 0.0 11/03/2023 1040    BMET    Component Value Date/Time   NA 138 11/03/2023 1040   NA 137 05/23/2022 1145   K 4.1 11/03/2023 1040   CL 105 11/03/2023 1040   CO2 24 11/03/2023 1040   GLUCOSE 165 (H) 11/03/2023 1040   BUN 26 (H) 11/03/2023 1040   BUN 20 05/23/2022 1145   CREATININE 1.01 11/03/2023 1040   CALCIUM  9.2 11/03/2023 1040   EGFR 66 05/23/2022 1145   GFRNONAA >60 11/03/2023 1040    IMAGING past 24 hours VAS US  LOWER EXTREMITY VENOUS (DVT) Result Date: 11/05/2023  Lower Venous DVT Study Patient Name:  Corey Nielsen  Date of Exam:   11/05/2023 Medical Rec #: [id number]        Accession #:    [id number] Date of Birth: [birth year]        Patient Gender: M Patient Age:  78 years Exam Location:  [facility] Procedure:      VAS US  LOWER EXTREMITY VENOUS (DVT) Referring Phys: ALM APO --------------------------------------------------------------------------------  Indications: Stroke, and Preop for loop recorder.  Comparison Study: 06/20/2014 - negative Performing Technologist: Ricka Sturdivant-Jones RDMS, RVT  Examination Guidelines: A complete evaluation includes B-mode imaging, spectral Doppler, color Doppler, and power Doppler as needed of all accessible portions of each vessel. Bilateral testing is  considered an integral part of a complete examination. Limited examinations for reoccurring indications may be performed as noted. The reflux portion of the exam is performed with the patient in reverse Trendelenburg.  +---------+---------------+---------+-----------+----------+--------------+ RIGHT    CompressibilityPhasicitySpontaneityPropertiesThrombus Aging +---------+---------------+---------+-----------+----------+--------------+ CFV      Full           Yes      Yes                                 +---------+---------------+---------+-----------+----------+--------------+ SFJ      Full                                                        +---------+---------------+---------+-----------+----------+--------------+ FV Prox  Full                                                        +---------+---------------+---------+-----------+----------+--------------+ FV Mid   Full                                                        +---------+---------------+---------+-----------+----------+--------------+  FV DistalFull                                                        +---------+---------------+---------+-----------+----------+--------------+ PFV      Full                                                        +---------+---------------+---------+-----------+----------+--------------+ POP      Full           Yes      Yes                                 +---------+---------------+---------+-----------+----------+--------------+ PTV      Full                                                        +---------+---------------+---------+-----------+----------+--------------+ PERO     Full                                                        +---------+---------------+---------+-----------+----------+--------------+   +---------+---------------+---------+-----------+----------+--------------+ LEFT      CompressibilityPhasicitySpontaneityPropertiesThrombus Aging +---------+---------------+---------+-----------+----------+--------------+ CFV      Full           Yes      Yes                                 +---------+---------------+---------+-----------+----------+--------------+ SFJ      Full                                                        +---------+---------------+---------+-----------+----------+--------------+ FV Prox  Full                                                        +---------+---------------+---------+-----------+----------+--------------+ FV Mid   Full                                                        +---------+---------------+---------+-----------+----------+--------------+ FV DistalFull                                                        +---------+---------------+---------+-----------+----------+--------------+  PFV      Full                                                        +---------+---------------+---------+-----------+----------+--------------+ POP      Full           Yes      Yes                                 +---------+---------------+---------+-----------+----------+--------------+ PTV      Full                                                        +---------+---------------+---------+-----------+----------+--------------+ PERO     Full                                                        +---------+---------------+---------+-----------+----------+--------------+    Summary: BILATERAL: - No evidence of deep vein thrombosis seen in the lower extremities, bilaterally. -No evidence of popliteal cyst, bilaterally.   *See table(s) above for measurements and observations.    Preliminary    ECHOCARDIOGRAM COMPLETE Result Date: 11/04/2023    ECHOCARDIOGRAM REPORT   Patient Name:   Corey Nielsen Date of Exam: 11/04/2023 Medical Rec #:  [id number]       Height:       77.0 in Accession #:    [id number]       Weight:       233.9 lb Date of Birth:  05-27-1945       BSA:          2.391 m Patient Age:   78 years        BP:           151/82 mmHg Patient Gender: M               HR:           69 bpm. Exam Location:  Inpatient Procedure: 2D Echo, Cardiac Doppler and Color Doppler (Both Spectral and Color            Flow Doppler were utilized during procedure). Indications:    Stroke  History:        Patient has no prior history of Echocardiogram examinations.                 Risk Factors:Hypertension, Dyslipidemia and Diabetes.  Sonographer:    Philomena Daring Referring Phys: [id number] DEREK W CALKINS IMPRESSIONS  1. Left ventricular ejection fraction, by estimation, is 55 to 60%. The left ventricle has normal function. The left ventricle has no regional wall motion abnormalities. There is mild concentric left ventricular hypertrophy. Left ventricular diastolic parameters are consistent with Grade I diastolic dysfunction (impaired relaxation).  2. Right ventricular systolic function is normal. The right ventricular size is mildly enlarged. Tricuspid regurgitation signal is inadequate for assessing PA pressure.  3. The mitral valve is normal in structure. Trivial  mitral valve regurgitation. No evidence of mitral stenosis.  4. The aortic valve is tricuspid. There is mild calcification of the aortic valve. Aortic valve regurgitation is trivial. No aortic stenosis is present.  5. The inferior vena cava is normal in size with greater than 50% respiratory variability, suggesting right atrial pressure of 3 mmHg.  6. Positive bubble study is suggestive of a PFO. FINDINGS  Left Ventricle: Left ventricular ejection fraction, by estimation, is 55 to 60%. The left ventricle has normal function. The left ventricle has no regional wall motion abnormalities. The left ventricular internal cavity size was normal in size. There is  mild concentric left ventricular hypertrophy. Left ventricular diastolic parameters are consistent with Grade I  diastolic dysfunction (impaired relaxation). Right Ventricle: The right ventricular size is mildly enlarged. No increase in right ventricular wall thickness. Right ventricular systolic function is normal. Tricuspid regurgitation signal is inadequate for assessing PA pressure. Left Atrium: Left atrial size was normal in size. Right Atrium: Right atrial size was normal in size. Pericardium: Trivial pericardial effusion is present. Mitral Valve: The mitral valve is normal in structure. Trivial mitral valve regurgitation. No evidence of mitral valve stenosis. Tricuspid Valve: The tricuspid valve is normal in structure. Tricuspid valve regurgitation is trivial. Aortic Valve: The aortic valve is tricuspid. There is mild calcification of the aortic valve. Aortic valve regurgitation is trivial. No aortic stenosis is present. Pulmonic Valve: The pulmonic valve was normal in structure. Pulmonic valve regurgitation is not visualized. Aorta: The aortic root is normal in size and structure. Venous: The inferior vena cava is normal in size with greater than 50% respiratory variability, suggesting right atrial pressure of 3 mmHg. IAS/Shunts: Positive bubble study is suggestive of a PFO.  LEFT VENTRICLE PLAX 2D LVIDd:         5.00 cm   Diastology LVIDs:         3.30 cm   LV e' medial:    9.79 cm/s LV PW:         1.30 cm   LV E/e' medial:  7.1 LV IVS:        1.20 cm   LV e' lateral:   9.25 cm/s LVOT diam:     2.30 cm   LV E/e' lateral: 7.5 LV SV:         81 LV SV Index:   34 LVOT Area:     4.15 cm  RIGHT VENTRICLE             IVC RV S prime:     13.20 cm/s  IVC diam: 1.50 cm TAPSE (M-mode): 2.0 cm LEFT ATRIUM             Index        RIGHT ATRIUM           Index LA diam:        4.80 cm 2.01 cm/m   RA Area:     16.20 cm LA Vol (A2C):   39.2 ml 16.40 ml/m  RA Volume:   35.70 ml  14.93 ml/m LA Vol (A4C):   38.2 ml 15.98 ml/m LA Biplane Vol: 39.4 ml 16.48 ml/m  AORTIC VALVE LVOT Vmax:   85.30 cm/s LVOT Vmean:  59.400 cm/s LVOT  VTI:    0.196 m  AORTA Ao Root diam: 3.40 cm Ao Asc diam:  3.70 cm MITRAL VALVE MV Area (PHT): 3.53 cm     SHUNTS MV Decel Time: 215 msec     Systemic VTI:  0.20 m MV E velocity: 69.20 cm/s   Systemic Diam: 2.30 cm MV A velocity: 107.00 cm/s MV E/A ratio:  0.65 Dalton McleanMD Electronically signed by Ezra Kanner Signature Date/Time: 11/04/2023/4:59:37 PM    Final     Vitals:   11/05/23 0625 11/05/23 0754 11/05/23 1225 11/05/23 1341  BP: 132/71 (!) 145/71 (!) 174/89 132/67  Pulse: (!) 59 61 85 80  Resp: 19 12 (!) 21   Temp: 98 F (36.7 C) 98.1 F (36.7 C) 97.7 F (36.5 C)   TempSrc: Oral Oral Oral   SpO2: 93% 99% 98%   Weight:      Height:         PHYSICAL EXAM General:  Alert, well-nourished, well-developed patient in no acute distress Psych:  Mood and affect appropriate for situation CV: Regular rate and rhythm on monitor Respiratory:  Regular, unlabored respirations on room air GI: Abdomen soft and nontender   NEURO:  Mental Status: AA&Ox3, patient is able to give clear and coherent history Speech/Language: speech is without dysarthria or aphasia.  Naming, repetition, fluency, and comprehension intact.  Cranial Nerves:  II: PERRL. Left dense homonymous hemianopsia III, IV, VI: EOMI. Eyelids elevate symmetrically.  V: Sensation is intact to light touch and symmetrical to face.  VII: Face is symmetrical resting and smiling VIII: hearing intact to voice. IX, X: Palate elevates symmetrically. Phonation is normal.  KP:Dynloizm shrug 5/5. XII: tongue is midline without fasciculations. Motor: 5/5 strength to all muscle groups tested.  Tone: is normal and bulk is normal Sensation- Intact to light touch bilaterally. Extinction absent to light touch to DSS.   Coordination: FTN intact bilaterally, HKS: no ataxia in BLE.No drift.  Gait- deferred  Most Recent NIH 2     ASSESSMENT/PLAN  Corey Nielsen is a 78 y.o. male with history of HTN, HLD, type 2 diabetes  mellitus with insulin  pump, diabetic neuropathy and retinopathy, BPH, migraines with aura, presenting with acute vision changes since Thursday.  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 2D Echo EF 55 to 60%.  Left atrial size normal.  Positive for right-to-left shunt. Lower extremity venous Dopplers pending LDL 65 HgbA1c 7.0 Loop recorder- requested VTE prophylaxis - heparin SQ No antithrombotic prior to admission, now on aspirin 81 mg daily and clopidogrel 75 mg daily for 3 weeks and then aspirin alone. Therapy recommendations:  Pending Disposition:  pending   Hx of Stroke/TIA Right medial occipital lobe ischemic infarct on imaging   Hypertension Home meds:  none Stable Blood Pressure Goal: SBP less than 160   Hyperlipidemia Home meds:  Lipitor 20mg , resumed in hospital LDL 65, goal < 70 Continue statin at discharge  Diabetes type II UnControlled Home meds:  metformin , actos , ozempic  HgbA1c 7.0, goal < 7.0 CBGs SSI Recommend close follow-up with PCP for better DM control  Other Stroke Risk Factors ETOH use,  advised to drink no more than 2 drink(s) a day Headaches/migraines  Other Active Problems BPH  Hospital day # 0  Patient presented with 4-5-day history of left-sided vision loss which initially attributed to migraine but MRI confirms right parietal occipital infarct likely cryptogenic etiology.  Recommend prolonged cardiac monitor for paroxysmal A-fib at discharge likely loop recorder.    Recommend dual antiplatelet therapy for 3 weeks  followed by aspirin alone and aggressive risk factor modification.  Patient advised not to drive till his peripheral vision loss improves.  Greater than 50% time during this 25-minute visit was spent in counseling and coordination  of care about his embolic stroke and discussion about further evaluation and treatment, questions.  Eather Popp, MD Medical Director [facility] Pager: [phone] 11/05/2023 2:40 PM    To contact Stroke Continuity provider, please refer to [email or web address]. After hours, contact General Neurology

## 2023-11-05 NOTE — Progress Notes (Signed)
 Pt discharge home via wheelchair. Discharge instructions reviewed with patient and he verbalizes understanding .

## 2023-11-05 NOTE — Plan of Care (Signed)
  Problem: Nutrition: Goal: Adequate nutrition will be maintained Outcome: Progressing   Problem: Elimination: Goal: Will not experience complications related to bowel motility Outcome: Progressing Goal: Will not experience complications related to urinary retention Outcome: Progressing   Problem: Pain Managment: Goal: General experience of comfort will improve and/or be controlled Outcome: Progressing   Problem: Safety: Goal: Ability to remain free from injury will improve Outcome: Progressing   Problem: Skin Integrity: Goal: Risk for impaired skin integrity will decrease Outcome: Progressing

## 2023-11-05 NOTE — Inpatient Diabetes Management (Signed)
 Inpatient Diabetes Program Recommendations  AACE/ADA: New Consensus Statement on Inpatient Glycemic Control (2015)  Target Ranges:  Prepandial:   less than 140 mg/dL      Peak postprandial:   less than 180 mg/dL (1-2 hours)      Critically ill patients:  140 - 180 mg/dL   Lab Results  Component Value Date   GLUCAP 244 (H) 11/05/2023   HGBA1C 7.0 (H) 11/03/2023    Review of Glycemic Control  Latest Reference Range & Units 11/04/23 07:36 11/04/23 11:56 11/04/23 16:37 11/04/23 21:31 11/05/23 06:02  Glucose-Capillary 70 - 99 mg/dL 785 (H) 743 (H) 823 (H) 209 (H) 244 (H)  (H): Data is abnormally high   Inpatient Diabetes Program Recommendations:    Please discontinue Insulin  Pump order as his pump is at home.  Please also consider:  Semglee 10 units QAM  Thank you, Wyvonna Pinal, MSN, CDCES Diabetes Coordinator Inpatient Diabetes Program [phone] (team pager from 8a-5p)

## 2023-11-05 NOTE — Interval H&P Note (Signed)
 History and Physical Interval Note:  11/05/2023 5:58 PM  Corey Nielsen  has presented today for surgery, with the diagnosis of cryptogenic stroke.  The various methods of treatment have been discussed with the patient and family. After consideration of risks, benefits and other options for treatment, the patient has consented to  Procedure(s): LOOP RECORDER INSERTION (N/A) as a surgical intervention.  The patient's history has been reviewed, patient examined, no change in status, stable for surgery.  I have reviewed the patient's chart and labs.  Questions were answered to the patient's satisfaction.     Fonda Kitty

## 2023-11-05 NOTE — Discharge Summary (Signed)
 Physician Discharge Summary   Corey Nielsen FMW:[id number] DOB: 09-Apr-1946 DOA: 11/03/2023  PCP: Dwight Trula SQUIBB, MD  Admit date: 11/03/2023 Discharge date: 11/05/2023   Admitted From: Home Disposition:  Home  Discharging physician: Alm Apo, MD Barriers to discharge: none  Recommendations at discharge: Follow up with neurology Evaluate loop recorder as necessary   Discharge Condition: stable CODE STATUS: Full  Diet recommendation:  Diet Orders (From admission, onward)     Start     Ordered   11/05/23 0000  Diet Carb Modified        11/05/23 1510   11/03/23 1725  Diet heart healthy/carb modified Room service appropriate? Yes; Fluid consistency: Thin  Diet effective now       Question Answer Comment  Diet-HS Snack? Nothing   Room service appropriate? Yes   Fluid consistency: Thin      11/03/23 1725            Hospital Course: Corey Nielsen is a 78 y.o. male with history of HTN, HLD, type 2 diabetes mellitus with insulin  pump, diabetic neuropathy and retinopathy, BPH, migraines with aura, presenting with acute vision changes since Thursday.   Patient apparently had acute episode of visual changes with floating scintillating diamonds that covered his visual field with associated right-sided sharp then dull headache and some nausea.   Initially attributing his symptoms to migraines however he had never had visual changes like this in the past.  He was evaluated by ophthalmology outpatient and after evaluation, recommended to present to the ER for further workup. MRI brain was notable for acute infarct involving the right medial occipital lobe. Further workup commenced on admission.  Assessment and Plan: * Acute CVA (cerebrovascular accident) [facility]) - Patient developed headache but also had left upper visual field deficits.  Initially attributed his symptoms to complicated migraine but vision deficits continued to worsen and/or at least persist; evaluated by  ophthalmology outpatient then presented to the ER - MRI brain showed acute nonhemorrhagic infarct involving right medial occipital lobe - CT angio head/neck negative for LVO or high-grade stenosis in the neck -Neurology following as well - Echo obtained 11/04/2023 and notable for positive bubble study.  EF 55 to 60%, no RWMA, mild LVH, grade 1 DD - PT/OT evals - DAPT x 21 days then mono asa per neuro - LDL 65; continued on Lipitor  - A1c 7%.  Continued on insulin  pump - loop recorder recommended by neuro as well; EP aware; placed 11/05/23 prior to d/c - LE duplex negative for DVT  Essential hypertension - BP at goal.  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 On day of discharge, patient was felt deemed stable for discharge. Patient/family member advised to call PCP or come back to ER if needed.   Principal Diagnosis: Acute CVA (cerebrovascular accident) [facility])  Discharge Diagnoses: Active Hospital Problems   Diagnosis Date Noted   Acute CVA (cerebrovascular accident) (HCC) 11/03/2023    Priority: 1.   Essential hypertension 11/03/2023    Priority: 2.   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   Visual field defect 11/03/2023   BPH (benign prostatic hyperplasia) 02/05/2007   Hyperlipidemia 02/05/2007    [facility]  Problems  No resolved problems to display.     Discharge Instructions     Ambulatory referral to Occupational Therapy   Complete by: As directed    Diet Carb Modified   Complete by: As directed    Increase activity slowly   Complete by: As directed       Allergies as of 11/05/2023       Reactions   Tetanus Toxoids  Shortness Of Breath   Horse serum   Amoxicillin    Tetanus Immune Globulin Anxiety   Other Reaction(s): Angioedema        Medication List     STOP taking these medications    ibuprofen 800 MG tablet Commonly known as: ADVIL       TAKE these medications    aspirin EC 81 MG tablet Take 1 tablet (81 mg total) by mouth daily. Swallow whole. Start taking on: November 06, 2023   atorvastatin  80 MG tablet Commonly known as: LIPITOR Take 1 tablet (80 mg total) by mouth daily. Start taking on: November 06, 2023 What changed:  medication strength how much to take how to take this when to take this additional instructions   clopidogrel 75 MG tablet Commonly known as: PLAVIX Take 1 tablet (75 mg total) by mouth daily for 18 days. Start taking on: November 06, 2023   CO Q 10 PO Take 300 mg by mouth daily.   cyanocobalamin  100 MCG tablet Take 100 mcg by mouth daily.   dutasteride  0.5 MG capsule Commonly known as: AVODART  Take 0.5 mg by mouth daily.   insulin  pump Soln Inject into the skin. Insulin  pump with novolog  insulin . Average glucose usually around 179 daily per patient   metFORMIN  500 MG 24 hr tablet Commonly known as: GLUCOPHAGE -XR Take 500 mg by mouth in the morning and at bedtime.   MULTIVITAMIN ADULTS PO Take 1 tablet by mouth daily.   mupirocin ointment 2 % Commonly known as: BACTROBAN Apply 1 Application topically 2 (two) times daily.   OneTouch Delica Lancets 33G Misc U UTD TO TEST BLOOD SUGARS 8 TIMES A DAY   OneTouch Verio test strip Generic drug: glucose blood U TO TEST EIGHT TIMES D UTD   Ozempic (0.25 or 0.5 MG/DOSE) 2 MG/3ML Sopn Generic drug: Semaglutide(0.25 or 0.5MG /DOS) Inject into the skin once a week.   pioglitazone  30 MG tablet Commonly known as: ACTOS  Take 1 tablet (30 mg total) by mouth daily.   tamsulosin  0.4 MG Caps capsule Commonly known as: FLOMAX  Take 0.4 mg by mouth every 12 (twelve) hours.   Vascepa 1 g capsule Generic drug:  icosapent Ethyl TAKE 2 CAPSULES BY MOUTH TWICE DAILY WITH MEALS FOR TRIGLYCERIDES        Follow-up Information     [facility]. Schedule an appointment as soon as possible for a visit.   Specialty: Rehabilitation Contact information: [address] [place]  [address] [phone]               Allergies  Allergen Reactions   Tetanus Toxoids Shortness Of Breath    Horse serum   Amoxicillin    Tetanus Immune Globulin Anxiety    Other Reaction(s): Angioedema    Consultations: Neurology EP  Procedures: 7/3: Loop recorder  Discharge Exam: BP (!) 147/78 (BP Location: Right Arm)   Pulse 68   Temp 97.8 F (36.6 C) (Oral)   Resp 16   Ht 6' 5 (1.956 m)  Wt 106.1 kg   SpO2 100%   BMI 27.74 kg/m  Physical Exam Constitutional:      General: He is not in acute distress.    Appearance: Normal appearance.  HENT:     Head: Normocephalic and atraumatic.     Mouth/Throat:     Mouth: Mucous membranes are moist.  Eyes:     Extraocular Movements: Extraocular movements intact.  Cardiovascular:     Rate and Rhythm: Normal rate and regular rhythm.  Pulmonary:     Effort: Pulmonary effort is normal. No respiratory distress.     Breath sounds: Normal breath sounds. No wheezing.  Abdominal:     General: Bowel sounds are normal. There is no distension.     Palpations: Abdomen is soft.     Tenderness: There is no abdominal tenderness.  Musculoskeletal:        General: Normal range of motion.     Cervical back: Normal range of motion and neck supple.  Skin:    General: Skin is warm and dry.  Neurological:     Mental Status: He is alert.     Comments: Decreased visual fields involving upper left quadrants  Psychiatric:        Mood and Affect: Mood normal.        Behavior: Behavior normal.      The results of significant diagnostics from this hospitalization (including imaging, microbiology, ancillary  and laboratory) are listed below for reference.   Microbiology: No results found for this or any previous visit (from the past 240 hours).   Labs: BNP (last 3 results) No results for input(s): BNP in the last 8760 hours. Basic Metabolic Panel: Recent Labs  Lab 11/03/23 1040  NA 138  K 4.1  CL 105  CO2 24  GLUCOSE 165*  BUN 26*  CREATININE 1.01  CALCIUM  9.2   Liver Function Tests: Recent Labs  Lab 11/03/23 1040  AST 20  ALT 15  ALKPHOS 76  BILITOT 0.9  PROT 6.2*  ALBUMIN 3.8   No results for input(s): LIPASE, AMYLASE in the last 168 hours. No results for input(s): AMMONIA in the last 168 hours. CBC: Recent Labs  Lab 11/03/23 1040  WBC 7.0  NEUTROABS 4.2  HGB 13.6  HCT 39.4  MCV 92.7  PLT 212   Cardiac Enzymes: No results for input(s): CKTOTAL, CKMB, CKMBINDEX, TROPONINI in the last 168 hours. BNP: Invalid input(s): POCBNP CBG: Recent Labs  Lab 11/04/23 1637 11/04/23 2131 11/05/23 0602 11/05/23 1223 11/05/23 1627  GLUCAP 176* 209* 244* 283* 234*   D-Dimer No results for input(s): DDIMER in the last 72 hours. Hgb A1c Recent Labs    11/03/23 1719  HGBA1C 7.0*   Lipid Profile Recent Labs    11/04/23 0537  CHOL 132  HDL 49  LDLCALC 65  TRIG 92  CHOLHDL 2.7   Thyroid  function studies No results for input(s): TSH, T4TOTAL, T3FREE, THYROIDAB in the last 72 hours.  Invalid input(s): FREET3 Anemia work up No results for input(s): VITAMINB12, FOLATE, FERRITIN, TIBC, IRON, RETICCTPCT in the last 72 hours. Urinalysis    Component Value Date/Time   COLORURINE LT YELLOW 03/07/2008 0725   APPEARANCEUR Clear 03/07/2008 0725   LABSPEC < OR = 1.005 03/07/2008 0725   PHURINE 5.5 03/07/2008 0725   GLUCOSEU 100 mg/dL (A) 88/96/7990 9274   HGBUR negative 01/25/2007 0823   BILIRUBINUR NEGATIVE 03/07/2008 0725   KETONESUR NEGATIVE 03/07/2008 0725   UROBILINOGEN 0.2 mg/dL 88/96/7990 9274   NITRITE  Negative  03/07/2008 0725   LEUKOCYTESUR Negative 03/07/2008 0725   Sepsis Labs Recent Labs  Lab 11/03/23 1040  WBC 7.0   Microbiology No results found for this or any previous visit (from the past 240 hours).  Procedures/Studies: VAS US  LOWER EXTREMITY VENOUS (DVT) Result Date: 11/05/2023  Lower Venous DVT Study Patient Name:  Corey Nielsen  Date of Exam:   11/05/2023 Medical Rec #: [id number]        Accession #:    [id number] Date of Birth: [birth year]        Patient Gender: M Patient Age:   5 years Exam Location:  [facility] Procedure:      VAS US  LOWER EXTREMITY VENOUS (DVT) Referring Phys: ALM APO --------------------------------------------------------------------------------  Indications: Stroke, and Preop for loop recorder.  Comparison Study: 06/20/2014 - negative Performing Technologist: Ricka Sturdivant-Jones RDMS, RVT  Examination Guidelines: A complete evaluation includes B-mode imaging, spectral Doppler, color Doppler, and power Doppler as needed of all accessible portions of each vessel. Bilateral testing is considered an integral part of a complete examination. Limited examinations for reoccurring indications may be performed as noted. The reflux portion of the exam is performed with the patient in reverse Trendelenburg.  +---------+---------------+---------+-----------+----------+--------------+ RIGHT    CompressibilityPhasicitySpontaneityPropertiesThrombus Aging +---------+---------------+---------+-----------+----------+--------------+ CFV      Full           Yes      Yes                                 +---------+---------------+---------+-----------+----------+--------------+ SFJ      Full                                                        +---------+---------------+---------+-----------+----------+--------------+ FV Prox  Full                                                         +---------+---------------+---------+-----------+----------+--------------+ FV Mid   Full                                                        +---------+---------------+---------+-----------+----------+--------------+ FV DistalFull                                                        +---------+---------------+---------+-----------+----------+--------------+ PFV      Full                                                        +---------+---------------+---------+-----------+----------+--------------+ POP      Full           Yes  Yes                                 +---------+---------------+---------+-----------+----------+--------------+ PTV      Full                                                        +---------+---------------+---------+-----------+----------+--------------+ PERO     Full                                                        +---------+---------------+---------+-----------+----------+--------------+   +---------+---------------+---------+-----------+----------+--------------+ LEFT     CompressibilityPhasicitySpontaneityPropertiesThrombus Aging +---------+---------------+---------+-----------+----------+--------------+ CFV      Full           Yes      Yes                                 +---------+---------------+---------+-----------+----------+--------------+ SFJ      Full                                                        +---------+---------------+---------+-----------+----------+--------------+ FV Prox  Full                                                        +---------+---------------+---------+-----------+----------+--------------+ FV Mid   Full                                                        +---------+---------------+---------+-----------+----------+--------------+ FV DistalFull                                                         +---------+---------------+---------+-----------+----------+--------------+ PFV      Full                                                        +---------+---------------+---------+-----------+----------+--------------+ POP      Full           Yes      Yes                                 +---------+---------------+---------+-----------+----------+--------------+ PTV      Full                                                        +---------+---------------+---------+-----------+----------+--------------+  PERO     Full                                                        +---------+---------------+---------+-----------+----------+--------------+    Summary: BILATERAL: - No evidence of deep vein thrombosis seen in the lower extremities, bilaterally. -No evidence of popliteal cyst, bilaterally.   *See table(s) above for measurements and observations.    Preliminary    ECHOCARDIOGRAM COMPLETE Result Date: 11/04/2023    ECHOCARDIOGRAM REPORT   Patient Name:   Corey Nielsen Date of Exam: 11/04/2023 Medical Rec #:  [id number]       Height:       77.0 in Accession #:    [id number]      Weight:       233.9 lb Date of Birth:  July 09, 1945       BSA:          2.391 m Patient Age:    45 years        BP:           151/82 mmHg Patient Gender: M               HR:           69 bpm. Exam Location:  Inpatient Procedure: 2D Echo, Cardiac Doppler and Color Doppler (Both Spectral and Color            Flow Doppler were utilized during procedure). Indications:    Stroke  History:        Patient has no prior history of Echocardiogram examinations.                 Risk Factors:Hypertension, Dyslipidemia and Diabetes.  Sonographer:    Philomena Daring Referring Phys: [id number] DEREK W CALKINS IMPRESSIONS  1. Left ventricular ejection fraction, by estimation, is 55 to 60%. The left ventricle has normal function. The left ventricle has no regional wall motion abnormalities. There is mild concentric left ventricular  hypertrophy. Left ventricular diastolic parameters are consistent with Grade I diastolic dysfunction (impaired relaxation).  2. Right ventricular systolic function is normal. The right ventricular size is mildly enlarged. Tricuspid regurgitation signal is inadequate for assessing PA pressure.  3. The mitral valve is normal in structure. Trivial mitral valve regurgitation. No evidence of mitral stenosis.  4. The aortic valve is tricuspid. There is mild calcification of the aortic valve. Aortic valve regurgitation is trivial. No aortic stenosis is present.  5. The inferior vena cava is normal in size with greater than 50% respiratory variability, suggesting right atrial pressure of 3 mmHg.  6. Positive bubble study is suggestive of a PFO. FINDINGS  Left Ventricle: Left ventricular ejection fraction, by estimation, is 55 to 60%. The left ventricle has normal function. The left ventricle has no regional wall motion abnormalities. The left ventricular internal cavity size was normal in size. There is  mild concentric left ventricular hypertrophy. Left ventricular diastolic parameters are consistent with Grade I diastolic dysfunction (impaired relaxation). Right Ventricle: The right ventricular size is mildly enlarged. No increase in right ventricular wall thickness. Right ventricular systolic function is normal. Tricuspid regurgitation signal is inadequate for assessing PA pressure. Left Atrium: Left atrial size was normal in size. Right Atrium: Right atrial size was normal in size. Pericardium: Trivial pericardial effusion is present. Mitral Valve:  The mitral valve is normal in structure. Trivial mitral valve regurgitation. No evidence of mitral valve stenosis. Tricuspid Valve: The tricuspid valve is normal in structure. Tricuspid valve regurgitation is trivial. Aortic Valve: The aortic valve is tricuspid. There is mild calcification of the aortic valve. Aortic valve regurgitation is trivial. No aortic stenosis is  present. Pulmonic Valve: The pulmonic valve was normal in structure. Pulmonic valve regurgitation is not visualized. Aorta: The aortic root is normal in size and structure. Venous: The inferior vena cava is normal in size with greater than 50% respiratory variability, suggesting right atrial pressure of 3 mmHg. IAS/Shunts: Positive bubble study is suggestive of a PFO.  LEFT VENTRICLE PLAX 2D LVIDd:         5.00 cm   Diastology LVIDs:         3.30 cm   LV e' medial:    9.79 cm/s LV PW:         1.30 cm   LV E/e' medial:  7.1 LV IVS:        1.20 cm   LV e' lateral:   9.25 cm/s LVOT diam:     2.30 cm   LV E/e' lateral: 7.5 LV SV:         81 LV SV Index:   34 LVOT Area:     4.15 cm  RIGHT VENTRICLE             IVC RV S prime:     13.20 cm/s  IVC diam: 1.50 cm TAPSE (M-mode): 2.0 cm LEFT ATRIUM             Index        RIGHT ATRIUM           Index LA diam:        4.80 cm 2.01 cm/m   RA Area:     16.20 cm LA Vol (A2C):   39.2 ml 16.40 ml/m  RA Volume:   35.70 ml  14.93 ml/m LA Vol (A4C):   38.2 ml 15.98 ml/m LA Biplane Vol: 39.4 ml 16.48 ml/m  AORTIC VALVE LVOT Vmax:   85.30 cm/s LVOT Vmean:  59.400 cm/s LVOT VTI:    0.196 m  AORTA Ao Root diam: 3.40 cm Ao Asc diam:  3.70 cm MITRAL VALVE MV Area (PHT): 3.53 cm     SHUNTS MV Decel Time: 215 msec     Systemic VTI:  0.20 m MV E velocity: 69.20 cm/s   Systemic Diam: 2.30 cm MV A velocity: 107.00 cm/s MV E/A ratio:  0.65 Dalton McleanMD Electronically signed by Ezra Kanner Signature Date/Time: 11/04/2023/4:59:37 PM    Final    MR BRAIN WO CONTRAST Result Date: 11/03/2023 CLINICAL DATA:  Neuro deficit, acute, stroke suspected EXAM: MRI HEAD WITHOUT CONTRAST TECHNIQUE: Multiplanar, multiecho pulse sequences of the brain and surrounding structures were obtained without intravenous contrast. COMPARISON:  CT angiogram of the head dated November 03, 2023. FINDINGS: Brain: There is restricted diffusion present in the right medial occipital lobe compatible with an acute  nonhemorrhagic infarct in the posterior cerebral artery distribution. There is increased T2 signal also present within the subcortical white matter and there is some hemosiderin staining. There is mild to moderate periventricular white matter disease. 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 Vascular: Normal flow voids. Skull and upper cervical spine: Normal marrow signal. No lesions are evident. Sinuses/Orbits: Polypoid mucosal disease within the maxillary sinuses. The orbits are unremarkable. Mild mucosal disease within the mastoid  air cells. Other: None. IMPRESSION: 1. Acute nonhemorrhagic infarct involving the right medial occipital lobe, possibly acute on chronic. 2. Mild to moderate periventricular white matter disease. Electronically Signed   By: Evalene Coho M.D.   On: 11/03/2023 16:16   CT ANGIO HEAD NECK W WO CM Result Date: 11/03/2023 CLINICAL DATA:  Neuro deficit, concern for stroke, bilateral temporal visual field deficit. Headache and vision changes in the left eye. EXAM: CT ANGIOGRAPHY HEAD AND NECK WITH AND WITHOUT CONTRAST TECHNIQUE: Multidetector CT imaging of the head and neck was performed using the standard protocol during bolus administration of intravenous contrast. Multiplanar CT image reconstructions and MIPs were obtained to evaluate the vascular anatomy. Carotid stenosis measurements (when applicable) are obtained utilizing NASCET criteria, using the distal internal carotid diameter as the denominator. RADIATION DOSE REDUCTION: This exam was performed according to the departmental dose-optimization program which includes automated exposure control, adjustment of the mA and/or kV according to patient size and/or use of iterative reconstruction technique. CONTRAST:  75mL OMNIPAQUE  IOHEXOL  350 MG/ML SOLN COMPARISON:  None Available. FINDINGS: CT HEAD FINDINGS Brain: No acute intracranial hemorrhage. No CT evidence of acute infarct. Nonspecific hypoattenuation in the  periventricular and subcortical white matter favored to reflect chronic microvascular ischemic changes. No edema, mass effect, or midline shift. The basilar cisterns are patent. Retro cerebellar fluid with intact cerebellar vermis suggestive of mega cisterna magna. Ventricles: Ventricles are normal in size and configuration. Vascular: No hyperdense vessel. Skull: No acute or aggressive finding. Sinuses/orbits: Orbits are symmetric. Mucosal thickening and mucous retention cysts in the bilateral maxillary sinuses and left sphenoid sinus. Other: Mastoid air cells are clear. CTA NECK FINDINGS Aortic arch: Standard configuration of the aortic arch. Imaged portion shows no evidence of aneurysm or dissection. No significant stenosis of the major arch vessel origins. Pulmonary arteries: As permitted by contrast timing, there are no filling defects in the visualized pulmonary arteries. Subclavian arteries: The subclavian arteries are patent bilaterally. Right carotid system: No evidence of dissection, stenosis (50% or greater), or occlusion. Mild atherosclerosis at the carotid bifurcation. Left carotid system: No evidence of dissection, stenosis (50% or greater), or occlusion. Vertebral arteries: Codominant. No evidence of dissection, stenosis (50% or greater), or occlusion. Skeleton: No acute findings. Degenerative changes in the cervical spine. Disc space narrowing greatest at C5-6 and C6-7. Anterior endplate osteophytes at multiple levels in the cervical spine. Other neck: The visualized airway is patent. No cervical lymphadenopathy. Upper chest: No acute findings. Review of the MIP images confirms the above findings CTA HEAD FINDINGS ANTERIOR CIRCULATION: The intracranial ICAs are patent bilaterally. Mild atherosclerosis of the right carotid siphon. No significant stenosis, proximal occlusion, aneurysm, or vascular malformation. MCAs: The middle cerebral arteries are patent bilaterally. ACAs: The anterior cerebral  arteries are patent bilaterally. POSTERIOR CIRCULATION: No significant stenosis, proximal occlusion, aneurysm, or vascular malformation. PCAs: The posterior cerebral arteries are patent bilaterally. Pcomm: Not well visualized. SCAs: The superior cerebellar arteries are patent bilaterally. Basilar artery: Patent AICAs: Not well visualized. PICAs: Patent Vertebral arteries: The intracranial vertebral arteries are patent. Venous sinuses: As permitted by contrast timing, patent. Anatomic variants: None Review of the MIP images confirms the above findings IMPRESSION: No large vessel occlusion. No high-grade stenosis, aneurysm, or dissection of the arteries in the head and neck. No CT evidence of acute intracranial abnormality. Mild chronic microvascular ischemic changes. Electronically Signed   By: Donnice Mania M.D.   On: 11/03/2023 13:39     Time coordinating discharge: Over 30 minutes  Alm Apo, MD  [facility] 11/05/2023, 5:04 PM

## 2023-11-07 ENCOUNTER — Encounter (HOSPITAL_COMMUNITY): Payer: Self-pay | Admitting: Cardiology

## 2023-11-09 ENCOUNTER — Other Ambulatory Visit (HOSPITAL_COMMUNITY): Payer: Self-pay

## 2023-11-16 DIAGNOSIS — E1165 Type 2 diabetes mellitus with hyperglycemia: Secondary | ICD-10-CM | POA: Diagnosis not present

## 2023-11-25 ENCOUNTER — Other Ambulatory Visit (HOSPITAL_COMMUNITY): Payer: Self-pay

## 2023-11-25 DIAGNOSIS — E1140 Type 2 diabetes mellitus with diabetic neuropathy, unspecified: Secondary | ICD-10-CM | POA: Diagnosis not present

## 2023-11-25 DIAGNOSIS — Z8673 Personal history of transient ischemic attack (TIA), and cerebral infarction without residual deficits: Secondary | ICD-10-CM | POA: Diagnosis not present

## 2023-11-25 DIAGNOSIS — Z09 Encounter for follow-up examination after completed treatment for conditions other than malignant neoplasm: Secondary | ICD-10-CM | POA: Diagnosis not present

## 2023-11-25 DIAGNOSIS — Z794 Long term (current) use of insulin: Secondary | ICD-10-CM | POA: Diagnosis not present

## 2023-11-25 DIAGNOSIS — Q2112 Patent foramen ovale: Secondary | ICD-10-CM | POA: Diagnosis not present

## 2023-11-26 ENCOUNTER — Telehealth: Payer: Self-pay | Admitting: Cardiovascular Disease

## 2023-11-26 NOTE — Telephone Encounter (Signed)
 Called pt advised MD is not in the office this week but will send message to be addressed upon return to office.

## 2023-11-26 NOTE — Telephone Encounter (Signed)
 Pt called in asking if Dr. Court can review his most recent notes from hospital and let him know if he has any recommendations for him or if he needs to make a f/u.

## 2023-12-02 ENCOUNTER — Encounter: Payer: Self-pay | Admitting: Cardiovascular Disease

## 2023-12-02 ENCOUNTER — Ambulatory Visit: Attending: Internal Medicine | Admitting: Occupational Therapy

## 2023-12-02 DIAGNOSIS — H5340 Unspecified visual field defects: Secondary | ICD-10-CM

## 2023-12-02 DIAGNOSIS — I639 Cerebral infarction, unspecified: Secondary | ICD-10-CM

## 2023-12-02 DIAGNOSIS — I69312 Visuospatial deficit and spatial neglect following cerebral infarction: Secondary | ICD-10-CM | POA: Diagnosis not present

## 2023-12-02 NOTE — Telephone Encounter (Signed)
 Follow Up:       Patient says he is returning Kryestyn's call from this morning.

## 2023-12-02 NOTE — Telephone Encounter (Signed)
 Spoke with pt regarding needing an appointment to see Dr. Court after recent stroke, echo with bubble study and loop recorder implant. Office visit scheduled.

## 2023-12-02 NOTE — Telephone Encounter (Signed)
 Spoke with pt. Office visit has been scheduled.

## 2023-12-02 NOTE — Telephone Encounter (Signed)
 Left message for patient to call back

## 2023-12-02 NOTE — Therapy (Signed)
 OUTPATIENT OCCUPATIONAL THERAPY NEURO EVALUATION  Patient Name: Corey Nielsen MRN: [id number] DOB:01-14-1946, 78 y.o., male Today's Date: 12/02/2023  PCP: Dwight Trula SQUIBB, MD REFERRING PROVIDER: Patsy Lenis, MD  END OF SESSION:  OT End of Session - 12/02/23 1236     Visit Number 1    Number of Visits 9    Date for OT Re-Evaluation 01/27/24    Authorization Type [employer] Medicare Choice    OT Start Time 1015    OT Stop Time 1059    OT Time Calculation (min) 44 min    Activity Tolerance Patient tolerated treatment well    Behavior During Therapy [facility] for tasks assessed/performed          Past Medical History:  Diagnosis Date   BENIGN PROSTATIC HYPERTROPHY 02/05/2007   DIABETES MELLITUS, TYPE II 02/05/2007   Type 1   Headache    migraine occasionally   History of swelling of feet    HYPERLIPIDEMIA 02/05/2007   Hypertension    Past Surgical History:  Procedure Laterality Date   COLONOSCOPY WITH PROPOFOL  N/A 05/29/2014   Procedure: COLONOSCOPY WITH PROPOFOL ;  Surgeon: Gladis MARLA Louder, MD;  Location: [facility];  Service: Endoscopy;  Laterality: N/A;   KIDNEY STONE SURGERY  2007   LOOP RECORDER INSERTION N/A 11/05/2023   Procedure: LOOP RECORDER INSERTION;  Surgeon: Kennyth Chew, MD;  Location: [facility] INVASIVE CV LAB;  Service: Cardiovascular;  Laterality: N/A;   NASAL SINUS SURGERY  1989   obstructing growth ( Dr.  Mitzie)   UMBILICAL HERNIA REPAIR  1996   Dr Nicholaus   Patient Active Problem List   Diagnosis Date Noted   Acute CVA (cerebrovascular accident) (HCC) 11/03/2023   Insulin  dependent diabetes mellitus type IA (HCC) 11/03/2023   Essential hypertension 11/03/2023   Visual field defect 11/03/2023   Atypical chest pain 03/19/2022   COUGH 07/04/2009   Diabetes uncomplicated adult-uncontrolled 02/05/2007   Hyperlipidemia 02/05/2007   BPH (benign prostatic hyperplasia) 02/05/2007   Insulin  dependent type 2 diabetes mellitus (HCC) 02/05/2007    ONSET DATE: November 05, 2023  REFERRING DIAG: H53.40, I63.9  THERAPY DIAG:  Visual field defect  Cerebrovascular accident (CVA), unspecified mechanism (HCC)  Occipital stroke (HCC)  Rationale for Evaluation and Treatment: Rehabilitation  SUBJECTIVE:   SUBJECTIVE STATEMENT: Pt reports no physical deficits after CVA, only describes visual deficit in Left upper quadrant as a grayed out area.  Pt accompanied by: self  PERTINENT HISTORY: migraines, insulin  dependent  PRECAUTIONS: None  WEIGHT BEARING RESTRICTIONS: No  PAIN:  Are you having pain? No  FALLS: Has patient fallen in last 6 months? No  LIVING ENVIRONMENT: Lives with: lives alone Lives in: House/apartment Stairs: No Has following equipment at home: none  PLOF: Independent, Independent with basic ADLs, Independent with household mobility without device, Independent with gait, and Independent with transfers  PATIENT GOALS: Pt wants to have visual deficits get better and wants to be able to see in L upper quadrant and reduce grey area.   OBJECTIVE:  Note: Objective measures were completed at Evaluation unless otherwise noted.  HAND DOMINANCE: Right  ADLs: Independent all ADL  IADLs: Shopping: Indep Light housekeeping: Indep Meal Prep: Indep Community mobility: Indep Medication management: Indep Financial management: Indep Handwriting: no deficits  MOBILITY STATUS: Independent  POSTURE COMMENTS:  No Significant postural limitations Sitting balance: Moves/returns truncal midpoint >2 inches in all planes  ACTIVITY TOLERANCE: Activity tolerance: no apparent deficits   FUNCTIONAL OUTCOME MEASURES: NA  UPPER EXTREMITY ROM:  Active ROM Right eval Left eval  Shoulder flexion WNL WNL  Shoulder abduction    Shoulder adduction    Shoulder extension    Shoulder internal rotation    Shoulder external rotation    Elbow flexion    Elbow extension    Wrist flexion    Wrist extension    Wrist ulnar deviation    Wrist  radial deviation    Wrist pronation    Wrist supination    (Blank rows = not tested)  UPPER EXTREMITY MMT:     MMT Right eval Left eval  Shoulder flexion    Shoulder abduction    Shoulder adduction    Shoulder extension    Shoulder internal rotation    Shoulder external rotation    Middle trapezius    Lower trapezius    Elbow flexion    Elbow extension    Wrist flexion    Wrist extension    Wrist ulnar deviation    Wrist radial deviation    Wrist pronation    Wrist supination    (Blank rows = not tested)  Grossly 4+/5 for BUE strength overall. No deficits from CVA.   HAND FUNCTION: Note tested hand function in multiple tests for hand to finger to nose test and [facility] testing.   COORDINATION: Finger Nose Finger test: WFL, minimal tremor on R side  9 Hole Peg test: Right: 33.5  sec; Left: 31.5 sec  SENSATION: WFL  EDEMA: none  MUSCLE TONE: RUE: Within functional limits and LUE: Within functional limits  COGNITION: Overall cognitive status: Within functional limits for tasks assessed  VISION: Subjective report: Pt reports no deficits other than visual deficits in Left upper quadrant Baseline vision: left upper quadrant deficits Visual history: no deficits at baseline  VISION ASSESSMENT: Impaired To be further assessed in functional context Tracking/Visual pursuits: Requires cues, head turns, or add eye shifts to track Visual Fields: left upper quadrant visual field impairment  Patient has difficulty with following activities due to following visual impairments: during all functional tasks pt has to use compensatory techniques to turn to see L upper quadrant   PERCEPTION: WFL  PRAXIS: WFL  OBSERVATIONS: Pt is a well appearing 78 yo male who sustained a CVA in early July of 2025 with no residual physical deficits from the stroke, but has a greying or blurred area in the left upper quadrant that is impacting quality of life.                                                                                                                               TREATMENT DATE:  12/02/23  Focus of session on education for CVA and visual field deficits, discussion and education on compensatory strategies while pt is driving, cooking, etc for safety to still complete IADL tasks.        PATIENT EDUCATION: Education details: discussion on visual strategies and compensation  Person educated: Patient Education method:  Explanation Education comprehension: needs further education  HOME EXERCISE PROGRAM: TBD   GOALS: Goals reviewed with patient? Yes  SHORT TERM GOALS: Target date: 12/30/23  Pt will report improvement in using visual compensatory strategies consistently with no more than minimal questions. Baseline: established and will continue to educate Goal status: INITIAL  2.  Pt will be able to locate 5/5 items in a room while visually scanning with no more than 1 verbal cue to locate to demonstrate improved visual acuity. Baseline: TBD Goal status: INITIAL  3.  Pt will report improvement in IADL tasks such as driving, work tasks as a professor, cooking, etc with improved report in seeing objects using tracking/scanning techniques Baseline: some deficits reported Goal status: INITIAL  4.  Pt will be Supervision with a vision specific HEP to maximize carryover for home use. Baseline:  TBD Goal status: INITIAL    LONG TERM GOALS: Target date: 01/27/24  Pt will report improvement in using visual compensatory strategies consistently with no questions. Baseline: established and will continue to educate Goal status: INITIAL  2.  Pt will be able to locate 5/5 items in a room while visually scanning with no cues to locate to demonstrate improved visual acuity. Baseline: TBD Goal status: INITIAL   3.  Pt will be MOD I with a vision specific HEP to maximize carryover for home use. Baseline:  TBD Goal status:  INITIAL    ASSESSMENT:  CLINICAL IMPRESSION: Patient is a 78 y.o. male who was seen today for occupational therapy evaluation for visual deficits after CVA.   PERFORMANCE DEFICITS: in functional skills including IADLs and vision, cognitive skills including NA, and psychosocial skills including coping strategies, environmental adaptation, habits, and routines and behaviors.   IMPAIRMENTS: are limiting patient from IADLs, work, and leisure.   CO-MORBIDITIES: has no other co-morbidities that affects occupational performance. Patient will benefit from skilled OT to address above impairments and improve overall function.  MODIFICATION OR ASSISTANCE TO COMPLETE EVALUATION: No modification of tasks or assist necessary to complete an evaluation.  OT OCCUPATIONAL PROFILE AND HISTORY: Problem focused assessment: Including review of records relating to presenting problem.  CLINICAL DECISION MAKING: LOW - limited treatment options, no task modification necessary  REHAB POTENTIAL: Good  EVALUATION COMPLEXITY: Low    PLAN:  OT FREQUENCY: 1x/week  OT DURATION: 8 weeks  PLANNED INTERVENTIONS: 02831 OT Re-evaluation, 97535 self care/ADL training, 02469 therapeutic activity, 97112 neuromuscular re-education, and patient/family education  RECOMMENDED OTHER SERVICES: NA  CONSULTED AND AGREED WITH PLAN OF CARE: Patient  PLAN FOR NEXT SESSION: scanning worksheets, cancellation work sheets, scanning environment, obstacle course? Maybe working on his cell phone   Chiquita JAYSON Hopping, OT 12/02/2023, 12:39 PM

## 2023-12-06 ENCOUNTER — Ambulatory Visit

## 2023-12-06 DIAGNOSIS — I2510 Atherosclerotic heart disease of native coronary artery without angina pectoris: Secondary | ICD-10-CM | POA: Diagnosis not present

## 2023-12-07 ENCOUNTER — Ambulatory Visit: Attending: Cardiovascular Disease | Admitting: Cardiovascular Disease

## 2023-12-07 ENCOUNTER — Encounter: Payer: Self-pay | Admitting: Cardiovascular Disease

## 2023-12-07 VITALS — BP 146/78 | HR 80 | Ht 77.0 in | Wt 235.9 lb

## 2023-12-07 DIAGNOSIS — Q2112 Patent foramen ovale: Secondary | ICD-10-CM

## 2023-12-07 DIAGNOSIS — I2510 Atherosclerotic heart disease of native coronary artery without angina pectoris: Secondary | ICD-10-CM | POA: Diagnosis not present

## 2023-12-07 LAB — CUP PACEART REMOTE DEVICE CHECK: Date Time Interrogation Session: 20250803211639

## 2023-12-07 NOTE — Assessment & Plan Note (Signed)
 History of hyperlipidemia on high-dose statin therapy lipid profile performed 11/04/2023 revealing a total cholesterol of 132, LDL 65 and HDL 49.

## 2023-12-07 NOTE — Progress Notes (Signed)
 12/07/2023 Corey Nielsen   1945/08/01  [id number]  Primary Physician Dwight Trula SQUIBB, MD Primary Cardiologist: Dorn JINNY Lesches MD [facility] [place], [place]  HPI:  Corey Nielsen is a 78 y.o.    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 He was referred by his PCP because of a episode of atypical chest pain.  I last saw him in the office 08/04/23.  His risk factors include remote tobacco abuse having quit in 1988. He has treated hyperlipidemia and diabetes. There is no family history for heart disease. Is never had heart attack or stroke. He had a prolonged episode of chest pain last Friday night lasting 5 to 6 hours which resolved spontaneously. There were no other associated symptoms. EMS was called but his pain resolved by the time he arrived. He had no recurrence.   I did a coronary CTA on him 05/27/2022 revealing a coronary calcium  score of 470 majority which was in the LAD which was FFR negative for significance.  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  Since I saw him 4 months ago he did unfortunately have a stroke on 11/03/2023 involving his right medial occipital lobe.  Dr. Sidra Kitty saw him and placed a loop recorder.  Transthoracic echocardiography performed 11/04/2023 suggested a PFO with positive bubble study.  CTA of the neck did not show any large vessel disease.   Current Meds  Medication Sig   aspirin  EC 81 MG tablet Take 1 tablet (81 mg total) by mouth daily. Swallow whole.   atorvastatin  (LIPITOR ) 80 MG tablet Take 1 tablet (80 mg total) by mouth daily.   Coenzyme Q10 (CO Q 10 PO) Take 300 mg by mouth daily.   cyanocobalamin  100 MCG tablet Take 100 mcg by mouth daily.   dutasteride  (AVODART ) 0.5 MG capsule Take 0.5 mg by mouth daily.   Insulin  Human (INSULIN  PUMP) 100 unit/ml SOLN Inject into the skin. Insulin  pump with novolog  insulin . Average glucose usually around 179 daily per patient    metFORMIN  (GLUCOPHAGE -XR) 500 MG 24 hr tablet Take 500 mg by mouth in the morning and at bedtime.   Multiple Vitamins-Minerals (MULTIVITAMIN ADULTS PO) Take 1 tablet by mouth daily.   mupirocin ointment (BACTROBAN) 2 % Apply 1 Application topically 2 (two) times daily.   ONETOUCH DELICA LANCETS 33G MISC U UTD TO TEST BLOOD SUGARS 8 TIMES A DAY   ONETOUCH VERIO test strip U TO TEST EIGHT TIMES D UTD   OZEMPIC, 0.25 OR 0.5 MG/DOSE, 2 MG/3ML SOPN Inject into the skin once a week.   pioglitazone  (ACTOS ) 30 MG tablet Take 1 tablet (30 mg total) by mouth daily.   tamsulosin  (FLOMAX ) 0.4 MG CAPS capsule Take 0.4 mg by mouth every 12 (twelve) hours.   VASCEPA 1 g capsule TAKE 2 CAPSULES BY MOUTH TWICE DAILY WITH MEALS FOR TRIGLYCERIDES     Allergies  Allergen Reactions   Tetanus Toxoids Shortness Of Breath    Horse serum   Amoxicillin    Tetanus Immune Globulin Anxiety    Other Reaction(s): Angioedema    Social History   Socioeconomic History   Marital status: Widowed    Spouse name: Not on file   Number of children: Not on file   Years of education: Not on file   Highest education level: Not on file  Occupational History   Not on file  Tobacco  Use   Smoking status: Former    Current packs/day: 0.00    Average packs/day: 0.5 packs/day for 25.0 years (12.5 ttl pk-yrs)    Types: Cigarettes    Start date: 05/05/1961    Quit date: 05/05/1986    Years since quitting: 37.6   Smokeless tobacco: Never  Substance and Sexual Activity   Alcohol use: Yes    Comment: rare   Drug use: No   Sexual activity: Not on file  Other Topics Concern   Not on file  Social History Narrative   Not on file   Social Drivers of Health   Financial Resource Strain: Not on file  Food Insecurity: No Food Insecurity (11/04/2023)   Hunger Vital Sign    Worried About Running Out of Food in the Last Year: Never true    Ran Out of Food in the Last Year: Never true  Transportation Needs: No Transportation Needs  (11/04/2023)   PRAPARE - Administrator, Civil Service (Medical): No    Lack of Transportation (Non-Medical): No  Physical Activity: Not on file  Stress: Not on file  Social Connections: Socially Isolated (11/04/2023)   Social Connection and Isolation Panel    Frequency of Communication with Friends and Family: More than three times a week    Frequency of Social Gatherings with Friends and Family: More than three times a week    Attends Religious Services: Never    Database administrator or Organizations: No    Attends Banker Meetings: Never    Marital Status: Widowed  Intimate Partner Violence: Not At Risk (11/04/2023)   Humiliation, Afraid, Rape, and Kick questionnaire    Fear of Current or Ex-Partner: No    Emotionally Abused: No    Physically Abused: No    Sexually Abused: No     Review of Systems: General: negative for chills, fever, night sweats or weight changes.  Cardiovascular: negative for chest pain, dyspnea on exertion, edema, orthopnea, palpitations, paroxysmal nocturnal dyspnea or shortness of breath Dermatological: negative for rash Respiratory: negative for cough or wheezing Urologic: negative for hematuria Abdominal: negative for nausea, vomiting, diarrhea, bright red blood per rectum, melena, or hematemesis Neurologic: negative for visual changes, syncope, or dizziness All other systems reviewed and are otherwise negative except as noted above.    Blood pressure (!) 146/78, pulse 80, height 6' 5 (1.956 m), weight 235 lb 14.4 oz (107 kg), SpO2 95%.  General appearance: alert and no distress Neck: no adenopathy, no carotid bruit, no JVD, supple, symmetrical, trachea midline, and thyroid  not enlarged, symmetric, no tenderness/mass/nodules Lungs: clear to auscultation bilaterally Heart: regular rate and rhythm, S1, S2 normal, no murmur, click, rub or gallop Extremities: extremities normal, atraumatic, no cyanosis or edema Pulses: 2+ and  symmetric Skin: Skin color, texture, turgor normal. No rashes or lesions Neurologic: Grossly normal  EKG not performed today      ASSESSMENT AND PLAN:   Hyperlipidemia History of hyperlipidemia on high-dose statin therapy lipid profile performed 11/04/2023 revealing a total cholesterol of 132, LDL 65 and HDL 49.  Acute CVA (cerebrovascular accident) [facility]) Patient had an acute on chronic right medial occipital lobe stroke thought to be potentially embolic 11/03/2023.  CT of the neck showed no large vessel disease.  2D echo did have a positive bubble study possibly consistent with a PFO.  A loop recorder was placed by Dr. Kennyth to rule out occult PAF.  Given the potential of right-to-left shunting with his  PFO I am going to refer him to Dr. Wonda for evaluation.  He may need a TEE versus a cardiac MRI to further evaluate.  Coronary artery disease Patient had CTA of his coronaries performed 05/27/2022 revealing a coronary calcium  score of 470 with no physiologically significant lesion by FFR analysis.  He is asymptomatic and at goal for secondary prevention.     Dorn DOROTHA Lesches MD FACP,FACC,FAHA, [facility] 12/07/2023 2:06 PM

## 2023-12-07 NOTE — Assessment & Plan Note (Signed)
 Patient had CTA of his coronaries performed 05/27/2022 revealing a coronary calcium  score of 470 with no physiologically significant lesion by FFR analysis.  He is asymptomatic and at goal for secondary prevention.

## 2023-12-07 NOTE — Patient Instructions (Signed)

## 2023-12-07 NOTE — Assessment & Plan Note (Signed)
 Patient had an acute on chronic right medial occipital lobe stroke thought to be potentially embolic 11/03/2023.  CT of the neck showed no large vessel disease.  2D echo did have a positive bubble study possibly consistent with a PFO.  A loop recorder was placed by Dr. Kennyth to rule out occult PAF.  Given the potential of right-to-left shunting with his PFO I am going to refer him to Dr. Wonda for evaluation.  He may need a TEE versus a cardiac MRI to further evaluate.

## 2023-12-09 ENCOUNTER — Ambulatory Visit: Attending: Internal Medicine | Admitting: Occupational Therapy

## 2023-12-09 DIAGNOSIS — H5340 Unspecified visual field defects: Secondary | ICD-10-CM | POA: Diagnosis not present

## 2023-12-09 DIAGNOSIS — I639 Cerebral infarction, unspecified: Secondary | ICD-10-CM | POA: Insufficient documentation

## 2023-12-09 DIAGNOSIS — I69398 Other sequelae of cerebral infarction: Secondary | ICD-10-CM | POA: Insufficient documentation

## 2023-12-09 NOTE — Therapy (Signed)
 OUTPATIENT OCCUPATIONAL THERAPY NEURO TREATMENT SESSION  Patient Name: Corey Nielsen MRN: [id number] DOB:11/01/1945, 78 y.o., male Today's Date: 12/09/2023  PCP: Dwight Trula SQUIBB, MD REFERRING PROVIDER: Patsy Lenis, MD  END OF SESSION:  OT End of Session - 12/09/23 1217     Visit Number 2    Number of Visits 9    Date for OT Re-Evaluation 01/27/24    Authorization Type [employer] Choice    OT Start Time 0930    OT Stop Time 1012    OT Time Calculation (min) 42 min    Activity Tolerance Patient tolerated treatment well    Behavior During Therapy [facility] for tasks assessed/performed           Past Medical History:  Diagnosis Date   BENIGN PROSTATIC HYPERTROPHY 02/05/2007   DIABETES MELLITUS, TYPE II 02/05/2007   Type 1   Headache    migraine occasionally   History of swelling of feet    HYPERLIPIDEMIA 02/05/2007   Hypertension    Past Surgical History:  Procedure Laterality Date   COLONOSCOPY WITH PROPOFOL  N/A 05/29/2014   Procedure: COLONOSCOPY WITH PROPOFOL ;  Surgeon: Gladis MARLA Louder, MD;  Location: [facility];  Service: Endoscopy;  Laterality: N/A;   KIDNEY STONE SURGERY  2007   LOOP RECORDER INSERTION N/A 11/05/2023   Procedure: LOOP RECORDER INSERTION;  Surgeon: Kennyth Chew, MD;  Location: [facility] INVASIVE CV LAB;  Service: Cardiovascular;  Laterality: N/A;   NASAL SINUS SURGERY  1989   obstructing growth ( Dr.  Mitzie)   UMBILICAL HERNIA REPAIR  1996   Dr Nicholaus   Patient Active Problem List   Diagnosis Date Noted   Coronary artery disease 12/07/2023   Acute CVA (cerebrovascular accident) (HCC) 11/03/2023   Insulin  dependent diabetes mellitus type IA (HCC) 11/03/2023   Essential hypertension 11/03/2023   Visual field defect 11/03/2023   Atypical chest pain 03/19/2022   COUGH 07/04/2009   Diabetes uncomplicated adult-uncontrolled 02/05/2007   Hyperlipidemia 02/05/2007   BPH (benign prostatic hyperplasia) 02/05/2007   Insulin  dependent type 2 diabetes  mellitus (HCC) 02/05/2007    ONSET DATE: November 05, 2023  REFERRING DIAG: H53.40, I63.9  THERAPY DIAG:  Visual field defect  Cerebrovascular accident (CVA), unspecified mechanism (HCC)  Occipital stroke (HCC)  Rationale for Evaluation and Treatment: Rehabilitation  SUBJECTIVE:   SUBJECTIVE STATEMENT: 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 He is driving and reports that is fine.   Pt accompanied by: self  PERTINENT HISTORY: migraines, insulin  dependent  PRECAUTIONS: None  WEIGHT BEARING RESTRICTIONS: No  PAIN:  Are you having pain? No  FALLS: Has patient fallen in last 6 months? No  LIVING ENVIRONMENT: Lives with: lives alone Lives in: House/apartment Stairs: No Has following equipment at home: none  PLOF: Independent, Independent with basic ADLs, Independent with household mobility without device, Independent with gait, and Independent with transfers  PATIENT GOALS: Pt wants to have visual deficits get better and wants to be able to see in L upper quadrant and reduce grey area.   OBJECTIVE:  Note: Objective measures were completed at Evaluation unless otherwise noted.  HAND DOMINANCE: Right  ADLs: Independent all ADL  IADLs: Shopping: Indep Light housekeeping: Indep Meal Prep: Indep Community mobility: Indep Medication management: Indep Financial management: Indep Handwriting: no deficits  MOBILITY STATUS: Independent  POSTURE COMMENTS:  No Significant postural limitations Sitting balance: Moves/returns truncal midpoint >2 inches in all planes  ACTIVITY TOLERANCE: Activity tolerance: no apparent deficits  FUNCTIONAL OUTCOME MEASURES: NA  UPPER EXTREMITY ROM:    Active ROM Right eval Left eval  Shoulder flexion WNL WNL  Shoulder abduction    Shoulder adduction    Shoulder extension    Shoulder internal rotation    Shoulder external rotation    Elbow flexion    Elbow extension    Wrist flexion     Wrist extension    Wrist ulnar deviation    Wrist radial deviation    Wrist pronation    Wrist supination    (Blank rows = not tested)  UPPER EXTREMITY MMT:     MMT Right eval Left eval  Shoulder flexion    Shoulder abduction    Shoulder adduction    Shoulder extension    Shoulder internal rotation    Shoulder external rotation    Middle trapezius    Lower trapezius    Elbow flexion    Elbow extension    Wrist flexion    Wrist extension    Wrist ulnar deviation    Wrist radial deviation    Wrist pronation    Wrist supination    (Blank rows = not tested)  Grossly 4+/5 for BUE strength overall. No deficits from CVA.   HAND FUNCTION: Note tested hand function in multiple tests for hand to finger to nose test and [facility] testing.   COORDINATION: Finger Nose Finger test: WFL, minimal tremor on R side  9 Hole Peg test: Right: 33.5  sec; Left: 31.5 sec  SENSATION: WFL  EDEMA: none  MUSCLE TONE: RUE: Within functional limits and LUE: Within functional limits  COGNITION: Overall cognitive status: Within functional limits for tasks assessed  VISION: Subjective report: Pt reports no deficits other than visual deficits in Left upper quadrant Baseline vision: left upper quadrant deficits Visual history: no deficits at baseline  VISION ASSESSMENT: Impaired To be further assessed in functional context Tracking/Visual pursuits: Requires cues, head turns, or add eye shifts to track Visual Fields: left upper quadrant visual field impairment  Patient has difficulty with following activities due to following visual impairments: during all functional tasks pt has to use compensatory techniques to turn to see L upper quadrant   PERCEPTION: WFL  PRAXIS: WFL  OBSERVATIONS: Pt is a well appearing 78 yo male who sustained a CVA in early July of 2025 with no residual physical deficits from the stroke, but has a greying or blurred area in the left upper quadrant that is  impacting quality of life.                                                                                                                              TREATMENT DATE:  12/09/23 Vision: focus of session on addressing visual scanning and attending to left upper visual field with various activities including the following: locating 10/10 cones throughout a busy room having the pt turn head, scan room, locate in various heights and planes. Erasing #1-10  in order along mirror with most numbers on the L side with 100% accuracy and pt able to scan and locate all numbers. Seated visual scanning task with red and blue cones shifting gaze and scanning in all quadrants.  Education on using compensatory techniques and attending to the L side in daily activities for cooking, laundry, work, etc to attend to the L side to improve awareness.  12/02/23  Focus of session on education for CVA and visual field deficits, discussion and education on compensatory strategies while pt is driving, cooking, etc for safety to still complete IADL tasks.        PATIENT EDUCATION: Education details: discussion on visual strategies and compensation  Person educated: Patient Education method: Explanation Education comprehension: needs further education  HOME EXERCISE PROGRAM: TBD   GOALS: Goals reviewed with patient? Yes  SHORT TERM GOALS: Target date: 12/30/23  Pt will report improvement in using visual compensatory strategies consistently with no more than minimal questions. Baseline: established and will continue to educate Goal status: INITIAL  2.  Pt will be able to locate 5/5 items in a room while visually scanning with no more than 1 verbal cue to locate to demonstrate improved visual acuity. Baseline: TBD Goal status: INITIAL  3.  Pt will report improvement in IADL tasks such as driving, work tasks as a professor, cooking, etc with improved report in seeing objects using tracking/scanning  techniques Baseline: some deficits reported Goal status: INITIAL  4.  Pt will be Supervision with a vision specific HEP to maximize carryover for home use. Baseline:  TBD Goal status: INITIAL    LONG TERM GOALS: Target date: 01/27/24  Pt will report improvement in using visual compensatory strategies consistently with no questions. Baseline: established and will continue to educate Goal status: INITIAL  2.  Pt will be able to locate 5/5 items in a room while visually scanning with no cues to locate to demonstrate improved visual acuity. Baseline: TBD Goal status: INITIAL   3.  Pt will be MOD I with a vision specific HEP to maximize carryover for home use. Baseline:  TBD Goal status: INITIAL    ASSESSMENT:  CLINICAL IMPRESSION: Patient is a 78 y.o. male who was seen today for occupational therapy evaluation for visual deficits after CVA. Pt would benefit from continued OT interventions to address visual deficits and improve L sided awareness.  PERFORMANCE DEFICITS: in functional skills including IADLs and vision, cognitive skills including NA, and psychosocial skills including coping strategies, environmental adaptation, habits, and routines and behaviors.   IMPAIRMENTS: are limiting patient from IADLs, work, and leisure.   CO-MORBIDITIES: has no other co-morbidities that affects occupational performance. Patient will benefit from skilled OT to address above impairments and improve overall function.  MODIFICATION OR ASSISTANCE TO COMPLETE EVALUATION: No modification of tasks or assist necessary to complete an evaluation.  OT OCCUPATIONAL PROFILE AND HISTORY: Problem focused assessment: Including review of records relating to presenting problem.  CLINICAL DECISION MAKING: LOW - limited treatment options, no task modification necessary  REHAB POTENTIAL: Good  EVALUATION COMPLEXITY: Low    PLAN:  OT FREQUENCY: 1x/week  OT DURATION: 8 weeks  PLANNED INTERVENTIONS:  02831 OT Re-evaluation, 97535 self care/ADL training, 02469 therapeutic activity, 97112 neuromuscular re-education, and patient/family education  RECOMMENDED OTHER SERVICES: NA  CONSULTED AND AGREED WITH PLAN OF CARE: Patient  PLAN FOR NEXT SESSION: scanning worksheets, cancellation work sheets, scanning environment, obstacle course? Maybe working on his cell phone   Chiquita JAYSON Hopping, OT 12/09/2023,  12:18 PM

## 2023-12-13 ENCOUNTER — Ambulatory Visit: Payer: Self-pay | Admitting: Cardiology

## 2023-12-15 ENCOUNTER — Ambulatory Visit: Admitting: Occupational Therapy

## 2023-12-15 ENCOUNTER — Encounter: Payer: Self-pay | Admitting: Occupational Therapy

## 2023-12-15 DIAGNOSIS — H5340 Unspecified visual field defects: Secondary | ICD-10-CM

## 2023-12-15 DIAGNOSIS — I69398 Other sequelae of cerebral infarction: Secondary | ICD-10-CM | POA: Diagnosis not present

## 2023-12-15 NOTE — Therapy (Signed)
 OUTPATIENT OCCUPATIONAL THERAPY NEURO TREATMENT SESSION  Patient Name: Corey Nielsen MRN: [id number] DOB:[birth year], 78 y.o., male Today's Date: 12/15/2023  PCP: Dwight Trula SQUIBB, MD REFERRING PROVIDER: Patsy Lenis, MD  END OF SESSION:     Past Medical History:  Diagnosis Date   BENIGN PROSTATIC HYPERTROPHY 02/05/2007   DIABETES MELLITUS, TYPE II 02/05/2007   Type 1   Headache    migraine occasionally   History of swelling of feet    HYPERLIPIDEMIA 02/05/2007   Hypertension    Past Surgical History:  Procedure Laterality Date   COLONOSCOPY WITH PROPOFOL  N/A 05/29/2014   Procedure: COLONOSCOPY WITH PROPOFOL ;  Surgeon: Gladis MARLA Louder, MD;  Location: [facility];  Service: Endoscopy;  Laterality: N/A;   KIDNEY STONE SURGERY  2007   LOOP RECORDER INSERTION N/A 11/05/2023   Procedure: LOOP RECORDER INSERTION;  Surgeon: Kennyth Chew, MD;  Location: [facility] INVASIVE CV LAB;  Service: Cardiovascular;  Laterality: N/A;   NASAL SINUS SURGERY  1989   obstructing growth ( Dr.  Mitzie)   UMBILICAL HERNIA REPAIR  1996   Dr Nicholaus   Patient Active Problem List   Diagnosis Date Noted   Coronary artery disease 12/07/2023   Acute CVA (cerebrovascular accident) (HCC) 11/03/2023   Insulin  dependent diabetes mellitus type IA (HCC) 11/03/2023   Essential hypertension 11/03/2023   Visual field defect 11/03/2023   Atypical chest pain 03/19/2022   COUGH 07/04/2009   Diabetes uncomplicated adult-uncontrolled 02/05/2007   Hyperlipidemia 02/05/2007   BPH (benign prostatic hyperplasia) 02/05/2007   Insulin  dependent type 2 diabetes mellitus (HCC) 02/05/2007    ONSET DATE: November 05, 2023  REFERRING DIAG: H53.40, I63.9  THERAPY DIAG:  No diagnosis found.  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 He is driving and reports that is fine.   Pt accompanied by:  self  PERTINENT HISTORY: migraines, insulin  dependent  PRECAUTIONS: None  WEIGHT BEARING RESTRICTIONS: No  PAIN:  Are you having pain? No  FALLS: Has patient fallen in last 6 months? No  LIVING ENVIRONMENT: Lives with: lives alone Lives in: House/apartment Stairs: No Has following equipment at home: none  PLOF: Independent, Independent with basic ADLs, Independent with household mobility without device, Independent with gait, and Independent with transfers  PATIENT GOALS: Pt wants to have visual deficits get better and wants to be able to see in L upper quadrant and reduce grey area.   OBJECTIVE:  Note: Objective measures were completed at Evaluation unless otherwise noted.  HAND DOMINANCE: Right  ADLs: Independent all ADL  IADLs: Shopping: Indep Light housekeeping: Indep Meal Prep: Indep Community mobility: Indep Medication management: Indep Financial management: Indep Handwriting: no deficits  MOBILITY STATUS: Independent  POSTURE COMMENTS:  No Significant postural limitations Sitting balance: Moves/returns truncal midpoint >2 inches in all planes  ACTIVITY TOLERANCE: Activity tolerance: no apparent deficits   FUNCTIONAL OUTCOME MEASURES: NA  UPPER EXTREMITY ROM:    Active ROM Right eval Left eval  Shoulder flexion WNL WNL  Shoulder abduction    Shoulder adduction    Shoulder extension    Shoulder internal rotation    Shoulder external rotation    Elbow flexion    Elbow extension    Wrist flexion    Wrist extension    Wrist ulnar deviation    Wrist radial deviation    Wrist pronation    Wrist supination    (Blank rows =  not tested)  UPPER EXTREMITY MMT:     MMT Right eval Left eval  Shoulder flexion    Shoulder abduction    Shoulder adduction    Shoulder extension    Shoulder internal rotation    Shoulder external rotation    Middle trapezius    Lower trapezius    Elbow flexion    Elbow extension    Wrist flexion    Wrist  extension    Wrist ulnar deviation    Wrist radial deviation    Wrist pronation    Wrist supination    (Blank rows = not tested)  Grossly 4+/5 for BUE strength overall. No deficits from CVA.   HAND FUNCTION: Note tested hand function in multiple tests for hand to finger to nose test and [facility] testing.   COORDINATION: Finger Nose Finger test: WFL, minimal tremor on R side  9 Hole Peg test: Right: 33.5  sec; Left: 31.5 sec  SENSATION: WFL  EDEMA: none  MUSCLE TONE: RUE: Within functional limits and LUE: Within functional limits  COGNITION: Overall cognitive status: Within functional limits for tasks assessed  VISION: Subjective report: Pt reports no deficits other than visual deficits in Left upper quadrant Baseline vision: left upper quadrant deficits Visual history: no deficits at baseline  VISION ASSESSMENT: Impaired To be further assessed in functional context Tracking/Visual pursuits: Requires cues, head turns, or add eye shifts to track Visual Fields: left upper quadrant visual field impairment  Patient has difficulty with following activities due to following visual impairments: during all functional tasks pt has to use compensatory techniques to turn to see L upper quadrant   PERCEPTION: WFL  PRAXIS: WFL  OBSERVATIONS: Pt is a well appearing 78 yo male who sustained a CVA in early July of 2025 with no residual physical deficits from the stroke, but has a greying or blurred area in the left upper quadrant that is impacting quality of life.                                                                                                                              TREATMENT DATE:  12/15/23 Patient reports improved compensatory strategies to address left superior visual field cut.  Patient compensating spontaneously, and shows excellent awareness of impairment.   Long discussion about functional implications at this point, and patient is preparing lectures,  preparing to travel across the country, driving, cooking - and while still experiences visual field cut - it is no longer impacting ability to complete BADL/IADL.   Strongly recommended Neuro-optometry to address remaining field cut as they will have better instrumentation to objectively measure loss/ changes, as well as potential options to correct, e.g. prisms, etc.  Patient in agreement and opted to discontinue further OT.   12/09/23 Vision: focus of session on addressing visual scanning and attending to left upper visual field with various activities including the following: locating 10/10 cones throughout a busy room having the pt  turn head, scan room, locate in various heights and planes. Erasing #1-10 in order along mirror with most numbers on the L side with 100% accuracy and pt able to scan and locate all numbers. Seated visual scanning task with red and blue cones shifting gaze and scanning in all quadrants.  Education on using compensatory techniques and attending to the L side in daily activities for cooking, laundry, work, etc to attend to the L side to improve awareness.  12/02/23  Focus of session on education for CVA and visual field deficits, discussion and education on compensatory strategies while pt is driving, cooking, etc for safety to still complete IADL tasks.        PATIENT EDUCATION: Education details: discussion on visual strategies and compensation  Person educated: Patient Education method: Explanation Education comprehension: needs further education  HOME EXERCISE PROGRAM: TBD   GOALS: Goals reviewed with patient? Yes  SHORT TERM GOALS: Target date: 12/30/23  Pt will report improvement in using visual compensatory strategies consistently with no more than minimal questions. Baseline: established and will continue to educate Goal status: Met  2.  Pt will be able to locate 5/5 items in a room while visually scanning with no more than 1 verbal cue to locate to  demonstrate improved visual acuity. Baseline: TBD Goal status: Met  3.  Pt will report improvement in IADL tasks such as driving, work tasks as a professor, cooking, etc with improved report in seeing objects using tracking/scanning techniques Baseline: some deficits reported Goal status:  Met  4.  Pt will be Supervision with a vision specific HEP to maximize carryover for home use. Baseline:  TBD Goal status: Met    LONG TERM GOALS: Target date: 01/27/24  Pt will report improvement in using visual compensatory strategies consistently with no questions. Baseline: established and will continue to educate Goal status: Met  2.  Pt will be able to locate 5/5 items in a room while visually scanning with no cues to locate to demonstrate improved visual acuity. Baseline: TBD Goal status: Met   3.  Pt will be MOD I with a vision specific HEP to maximize carryover for home use. Baseline:  TBD Goal status: Met    ASSESSMENT:  CLINICAL IMPRESSION: Patient has returned to work and driving.  Patient is aware of visual field cut, and compensating spontaneously.  Patient recommended to see Neuro-optometry, resources given.  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    PERFORMANCE DEFICITS: in functional skills including IADLs and vision, cognitive skills including NA, and psychosocial skills including coping strategies, environmental adaptation, habits, and routines and behaviors.   IMPAIRMENTS: are limiting patient from IADLs, work, and leisure.   CO-MORBIDITIES: has no other co-morbidities that affects occupational performance. Patient will benefit from skilled OT to address above impairments and improve overall function.  MODIFICATION OR ASSISTANCE TO COMPLETE EVALUATION: No modification of tasks or assist necessary to complete an evaluation.  OT OCCUPATIONAL PROFILE AND HISTORY: Problem focused  assessment: Including review of records relating to presenting problem.  CLINICAL DECISION MAKING: LOW - limited treatment options, no task modification necessary  REHAB POTENTIAL: Good  EVALUATION COMPLEXITY: Low    PLAN:  OT FREQUENCY: 1x/week  OT DURATION: 8 weeks  PLANNED INTERVENTIONS: 02831 OT Re-evaluation, 97535 self care/ADL training, 02469 therapeutic activity, 97112 neuromuscular re-education, and patient/family education  RECOMMENDED OTHER SERVICES: NA  CONSULTED AND AGREED WITH PLAN OF  CARE: Patient  PLAN FOR NEXT SESSION: Discharge  Eulises Kijowski M, OT 12/15/2023, 3:59 PM

## 2023-12-17 DIAGNOSIS — E1165 Type 2 diabetes mellitus with hyperglycemia: Secondary | ICD-10-CM | POA: Diagnosis not present

## 2023-12-21 DIAGNOSIS — I129 Hypertensive chronic kidney disease with stage 1 through stage 4 chronic kidney disease, or unspecified chronic kidney disease: Secondary | ICD-10-CM | POA: Diagnosis not present

## 2023-12-21 DIAGNOSIS — E1165 Type 2 diabetes mellitus with hyperglycemia: Secondary | ICD-10-CM | POA: Diagnosis not present

## 2023-12-21 DIAGNOSIS — N182 Chronic kidney disease, stage 2 (mild): Secondary | ICD-10-CM | POA: Diagnosis not present

## 2023-12-21 DIAGNOSIS — Z794 Long term (current) use of insulin: Secondary | ICD-10-CM | POA: Diagnosis not present

## 2023-12-21 DIAGNOSIS — E1122 Type 2 diabetes mellitus with diabetic chronic kidney disease: Secondary | ICD-10-CM | POA: Diagnosis not present

## 2023-12-21 DIAGNOSIS — E785 Hyperlipidemia, unspecified: Secondary | ICD-10-CM | POA: Diagnosis not present

## 2023-12-21 DIAGNOSIS — E1169 Type 2 diabetes mellitus with other specified complication: Secondary | ICD-10-CM | POA: Diagnosis not present

## 2023-12-24 ENCOUNTER — Ambulatory Visit: Admitting: Occupational Therapy

## 2023-12-24 DIAGNOSIS — E1165 Type 2 diabetes mellitus with hyperglycemia: Secondary | ICD-10-CM | POA: Diagnosis not present

## 2023-12-24 DIAGNOSIS — E11649 Type 2 diabetes mellitus with hypoglycemia without coma: Secondary | ICD-10-CM | POA: Diagnosis not present

## 2024-01-01 ENCOUNTER — Encounter: Admitting: Occupational Therapy

## 2024-01-06 ENCOUNTER — Ambulatory Visit

## 2024-01-06 DIAGNOSIS — I2510 Atherosclerotic heart disease of native coronary artery without angina pectoris: Secondary | ICD-10-CM

## 2024-01-07 ENCOUNTER — Encounter: Admitting: Occupational Therapy

## 2024-01-07 LAB — CUP PACEART REMOTE DEVICE CHECK: Date Time Interrogation Session: 20250903211820

## 2024-01-10 ENCOUNTER — Ambulatory Visit: Payer: Self-pay | Admitting: Cardiology

## 2024-01-11 ENCOUNTER — Other Ambulatory Visit (HOSPITAL_COMMUNITY): Payer: Self-pay

## 2024-01-11 ENCOUNTER — Ambulatory Visit: Attending: Cardiovascular Disease | Admitting: Cardiovascular Disease

## 2024-01-11 ENCOUNTER — Encounter: Payer: Self-pay | Admitting: Cardiovascular Disease

## 2024-01-11 VITALS — BP 124/74 | HR 65 | Ht 76.0 in | Wt 234.2 lb

## 2024-01-11 DIAGNOSIS — I10 Essential (primary) hypertension: Secondary | ICD-10-CM | POA: Diagnosis not present

## 2024-01-11 DIAGNOSIS — Q2112 Patent foramen ovale: Secondary | ICD-10-CM

## 2024-01-11 NOTE — Progress Notes (Signed)
 Cardiology Office Note:    Date:  01/11/2024   ID:  LEV CERVONE, DOB [birth year], MRN [id number]  PCP:  Dwight Trula SQUIBB, MD   [facility] Providers Cardiologist:  Dorn Lesches, MD     Referring MD: Dwight Trula SQUIBB, MD   Chief Complaint  Patient presents with   PFO    History of Present Illness:    Corey Nielsen is a 78 y.o. male referred by Dr Lesches for PFO evaluation.  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  He presented with a stroke on November 03, 2023 involving the right medial occipital lobe with headache and visual symptoms.  His vision has yet to normalize, but he has no other residual deficit reported.  He has known atherosclerotic CAD with negative CT-FFR analysis.  The patient underwent a loop recorder implant to monitor for atrial fibrillation.  He has nonobstructive carotid plaquing but no evidence of any large vessel occlusive disease.  He was found to have a positive bubble study suggestive of PFO on a 2D echocardiogram.  The patient has no exertional chest pain or pressure, leg edema, heart palpitations, orthopnea, or PND.  He has been on aspirin  since his stroke but was not taking it prior to that.  His atorvastatin  dose has been increased.  Otherwise he reports no recent medication changes.   Current Medications: Current Meds  Medication Sig   aspirin  EC 81 MG tablet Take 1 tablet (81 mg total) by mouth daily. Swallow whole.   atorvastatin  (LIPITOR ) 80 MG tablet Take 1 tablet (80 mg total) by mouth daily.   Coenzyme Q10 (CO Q 10 PO) Take 300 mg by mouth daily.   cyanocobalamin  100 MCG tablet Take 100 mcg by mouth daily.   dutasteride  (AVODART ) 0.5 MG capsule Take 0.5 mg by mouth daily.   insulin  aspart (NOVOLOG ) 100 UNIT/ML injection SMARTSIG:0-80 Unit(s) Daily   Insulin  Human (INSULIN  PUMP) 100 unit/ml SOLN Inject into the skin. Insulin  pump with novolog  insulin . Average glucose usually around 179 daily per patient    metFORMIN  (GLUCOPHAGE -XR) 500 MG 24 hr tablet Take 500 mg by mouth in the morning and at bedtime.   Multiple Vitamins-Minerals (MULTIVITAMIN ADULTS PO) Take 1 tablet by mouth daily.   ONETOUCH DELICA LANCETS 33G MISC U UTD TO TEST BLOOD SUGARS 8 TIMES A DAY   ONETOUCH VERIO test strip U TO TEST EIGHT TIMES D UTD   pioglitazone  (ACTOS ) 30 MG tablet Take 1 tablet (30 mg total) by mouth daily.   tamsulosin  (FLOMAX ) 0.4 MG CAPS capsule Take 0.4 mg by mouth every 12 (twelve) hours.   VASCEPA 1 g capsule TAKE 2 CAPSULES BY MOUTH TWICE DAILY WITH MEALS FOR TRIGLYCERIDES     Allergies:   Tetanus toxoid-containing vaccines, Amoxicillin, and Tetanus immune globulin   ROS:   Please see the history of present illness.    All other systems reviewed and are negative.  EKGs/Labs/Other Studies Reviewed:    The following studies were reviewed today: Cardiac Studies & Procedures   ______________________________________________________________________________________________     ECHOCARDIOGRAM  ECHOCARDIOGRAM COMPLETE 11/04/2023  Narrative ECHOCARDIOGRAM REPORT    Patient Name:   COMMODORE BELLEW Date of Exam: 11/04/2023 Medical Rec #:  [id number]       Height:       77.0 in Accession #:    [id number]      Weight:       233.9 lb Date of Birth:  02-15-1946       BSA:          2.391 m Patient Age:    77 years        BP:           151/82 mmHg Patient Gender: M               HR:           69 bpm. Exam Location:  Inpatient  Procedure: 2D Echo, Cardiac Doppler and Color Doppler (Both Spectral and Color Flow Doppler were utilized during procedure).  Indications:    Stroke  History:        Patient has no prior history of Echocardiogram examinations. Risk Factors:Hypertension, Dyslipidemia and Diabetes.  Sonographer:    Philomena Daring Referring Phys: [id number] DEREK W CALKINS  IMPRESSIONS   1. Left ventricular ejection fraction, by estimation, is 55 to 60%. The left ventricle has normal function.  The left ventricle has no regional wall motion abnormalities. There is mild concentric left ventricular hypertrophy. Left ventricular diastolic parameters are consistent with Grade I diastolic dysfunction (impaired relaxation). 2. Right ventricular systolic function is normal. The right ventricular size is mildly enlarged. Tricuspid regurgitation signal is inadequate for assessing PA pressure. 3. The mitral valve is normal in structure. Trivial mitral valve regurgitation. No evidence of mitral stenosis. 4. The aortic valve is tricuspid. There is mild calcification of the aortic valve. Aortic valve regurgitation is trivial. No aortic stenosis is present. 5. The inferior vena cava is normal in size with greater than 50% respiratory variability, suggesting right atrial pressure of 3 mmHg. 6. Positive bubble study is suggestive of a PFO.  FINDINGS Left Ventricle: Left ventricular ejection fraction, by estimation, is 55 to 60%. The left ventricle has normal function. The left ventricle has no regional wall motion abnormalities. The left ventricular internal cavity size was normal in size. There is mild concentric left ventricular hypertrophy. Left ventricular diastolic parameters are consistent with Grade I diastolic dysfunction (impaired relaxation).  Right Ventricle: The right ventricular size is mildly enlarged. No increase in right ventricular wall thickness. Right ventricular systolic function is normal. Tricuspid regurgitation signal is inadequate for assessing PA pressure.  Left Atrium: Left atrial size was normal in size.  Right Atrium: Right atrial size was normal in size.  Pericardium: Trivial pericardial effusion is present.  Mitral Valve: The mitral valve is normal in structure. Trivial mitral valve regurgitation. No evidence of mitral valve stenosis.  Tricuspid Valve: The tricuspid valve is normal in structure. Tricuspid valve regurgitation is trivial.  Aortic Valve: The aortic valve  is tricuspid. There is mild calcification of the aortic valve. Aortic valve regurgitation is trivial. No aortic stenosis is present.  Pulmonic Valve: The pulmonic valve was normal in structure. Pulmonic valve regurgitation is not visualized.  Aorta: The aortic root is normal in size and structure.  Venous: The inferior vena cava is normal in size with greater than 50% respiratory variability, suggesting right atrial pressure of 3 mmHg.  IAS/Shunts: Positive bubble study is suggestive of a PFO.   LEFT VENTRICLE PLAX 2D LVIDd:         5.00 cm   Diastology LVIDs:         3.30 cm   LV e' medial:    9.79 cm/s LV PW:         1.30 cm   LV E/e' medial:  7.1 LV IVS:        1.20 cm   LV  e' lateral:   9.25 cm/s LVOT diam:     2.30 cm   LV E/e' lateral: 7.5 LV SV:         81 LV SV Index:   34 LVOT Area:     4.15 cm   RIGHT VENTRICLE             IVC RV S prime:     13.20 cm/s  IVC diam: 1.50 cm TAPSE (M-mode): 2.0 cm  LEFT ATRIUM             Index        RIGHT ATRIUM           Index LA diam:        4.80 cm 2.01 cm/m   RA Area:     16.20 cm LA Vol (A2C):   39.2 ml 16.40 ml/m  RA Volume:   35.70 ml  14.93 ml/m LA Vol (A4C):   38.2 ml 15.98 ml/m LA Biplane Vol: 39.4 ml 16.48 ml/m AORTIC VALVE LVOT Vmax:   85.30 cm/s LVOT Vmean:  59.400 cm/s LVOT VTI:    0.196 m  AORTA Ao Root diam: 3.40 cm Ao Asc diam:  3.70 cm  MITRAL VALVE MV Area (PHT): 3.53 cm     SHUNTS MV Decel Time: 215 msec     Systemic VTI:  0.20 m MV E velocity: 69.20 cm/s   Systemic Diam: 2.30 cm MV A velocity: 107.00 cm/s MV E/A ratio:  0.65  Dalton McleanMD Electronically signed by Ezra Kanner Signature Date/Time: 11/04/2023/4:59:37 PM    Final      CT SCANS  CT CORONARY MORPH W/CTA COR W/SCORE 05/27/2022  Addendum 05/28/2022  9:26 AM ADDENDUM REPORT: 05/28/2022 09:24  EXAM: OVER-READ INTERPRETATION  CT CHEST  The following report is an over-read performed by radiologist Dr. Franky Leff  [facility] on 05/28/2022. This over-read does not include interpretation of cardiac or coronary anatomy or pathology. The coronary CTA interpretation by the cardiologist is attached.  COMPARISON:  04/07/2022  FINDINGS: Heart is normal size. Aorta normal caliber. No adenopathy. No confluent opacities or effusions. Linear scarring in the lung bases. No acute findings in the upper abdomen. Chest wall soft tissues are unremarkable. No acute bony abnormality.  IMPRESSION: No acute extra cardiac abnormality.  Bibasilar scarring.   Electronically Signed By: Franky Crease M.D. On: 05/28/2022 09:24  Narrative CLINICAL DATA:  This is a 78 year old male with anginal symptoms.  EXAM: Cardiac/Coronary  CTA  TECHNIQUE: The patient was scanned on a [employer].  FINDINGS: A 100 kV prospective scan was triggered in the descending thoracic aorta at 111 HU's. Axial non-contrast 3 mm slices were carried out through the heart. The data set was analyzed on a dedicated work station and scored using the Agatson method. Gantry rotation speed was 250 msecs and collimation was .6 mm. No beta blockade and 0.8 mg of sl NTG was given. The 3D data set was reconstructed in 5% intervals of the 67-82 % of the R-R cycle. Diastolic phases were analyzed on a dedicated work station using MPR, MIP and VRT modes. The patient received 80 cc of contrast.  Image quality: Fair, Misregistration artifact.  Aorta: Normal size.  No calcifications.  No dissection.  Aortic Valve:  Trileaflet.  No calcifications.  Coronary Arteries:  Normal coronary origin.  Right dominance.  RCA is a large dominant artery that gives rise to PDA and PLA. There is diffuse minimal (<24%) calcified plaqued  through out the RCA.  Left main is a large artery that gives rise to LAD and LCX arteries.  LAD is a large vessel. There is moderate (50-69%) calcified plaque in the proximal LAD. The mid LAD with mild  (25-49%) calcified plaque. The distal LAD with mild scattered calcified plaque.  LCX is a non-dominant artery that gives rise to one large OM1 branch. There is minimal (<24%) calcified plaque.  Coronary Calcium  Score:  Left main: 0  Left anterior descending artery: 377  Left circumflex artery: 0  Right coronary artery: 29  Total: 407  Percentile: 56  Other findings:  Normal pulmonary vein drainage into the left atrium.  Normal left atrial appendage without a thrombus.  Normal size of the pulmonary artery.  IMPRESSION: 1. Coronary calcium  score of 407. This was 2 percentile for age and sex matched control.  2. Normal coronary origin with right dominance.  3. CAD-RADS 3. Moderate stenosis. Consider symptom-guided anti-ischemic pharmacotherapy as well as risk factor modification per guideline directed care. Additional analysis with CT FFR will be submitted.  The noncardiac portion of this study will be interpreted in separate report by the radiologist.  Electronically Signed: By: Kardie  Tobb D.O. On: 05/27/2022 12:04   CT SCANS  CT CARDIAC SCORING (SELF PAY ONLY) 04/07/2022  Addendum 04/07/2022 12:31 PM ADDENDUM REPORT: 04/07/2022 12:28  ADDENDUM: The following report is an over-read performed by radiologist Dr. Reyes Holder of [facility] on <CurrentDate>. This over-read does not include interpretation of cardiac or coronary anatomy or pathology. The coronary calcium  score/coronary CTA interpretation by the cardiologist is attached.  COMPARISON:  None.  FINDINGS: Vascular: No acute non-cardiac vascular finding.  Mediastinum/Nodes: Partially visualized fluid density lesion along the posterior left lateral margin of the esophagus measures 23 x 15 mm on image 1/3.  Lungs/Pleura: Scattered atelectasis/scarring.  Upper Abdomen: No acute abnormality.  Musculoskeletal: Multilevel degenerative changes spine.  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 Consider further evaluation by dedicated chest CT with contrast.   Electronically Signed By: Reyes Holder M.D. On: 04/07/2022 12:28  Narrative CLINICAL DATA:  Risk stratification: 78 Year-old White Male  EXAM: Coronary Calcium  Score  TECHNIQUE: The patient was scanned on a [employer]. Axial non-contrast 3 mm slices were carried out through the heart. The data set was analyzed on a dedicated work station and scored using the Agatson method.  FINDINGS: Non-cardiac: See separate report from [facility].  Ascending Aorta: Normal caliber. No calcifications.  Aortic Valve Calcium  score: 53  Mitral annular calcification: None  Pericardium: Normal.  Coronary arteries: Normal origins.  Coronary Calcium  Score:  Left main: 25  Left anterior descending artery: 486  Left circumflex artery: 1.33  Right coronary artery: 25  Total: 539  Percentile: 64th for age, sex, and race matched control.  IMPRESSION: 1. Coronary calcium  score of 539. This was 64th percentile for age, gender, and race matched controls.  RECOMMENDATIONS:  The proposed cut-off value of 1,651 AU yielded a 93 % sensitivity and 75 % specificity in grading AS severity in patients with classical low-flow, low-gradient AS. Proposed different cut-off values to define severe AS for men and women as 2,065 AU and 1,274 AU, respectively. The joint European and American recommendations for the assessment of AS consider the aortic valve calcium  score as a continuum - a very high  calcium  score suggests severe AS and a low calcium  score suggests severe AS is unlikely.  Donney VEAR Jarome LULLA Stephen RENETTE, et al. 2017 ESC/EACTS Guidelines for the management of valvular heart disease. Eur Heart  J 2017;38:2739-91.  Coronary artery calcium  (CAC) score is a strong predictor of incident coronary heart disease (CHD) and provides predictive information beyond traditional risk factors. CAC scoring is reasonable to use in the decision to withhold, postpone, or initiate statin therapy in intermediate-risk or selected borderline-risk asymptomatic adults (age 64-75 years and LDL-C >=70 to <190 mg/dL) who do not have diabetes or established atherosclerotic cardiovascular disease (ASCVD).* In intermediate-risk (10-year ASCVD risk >=7.5% to <20%) adults or selected borderline-risk (10-year ASCVD risk >=5% to <7.5%) adults in whom a CAC score is measured for the purpose of making a treatment decision the following recommendations have been made:  If CAC = 0, it is reasonable to withhold statin therapy and reassess in 5 to 10 years, as long as higher risk conditions are absent (diabetes mellitus, family history of premature CHD in first degree relatives (males <55 years; females <65 years), cigarette smoking, LDL >=190 mg/dL or other independent risk factors).  If CAC is 1 to 99, it is reasonable to initiate statin therapy for patients >=52 years of age.  If CAC is >=100 or >=75th percentile, it is reasonable to initiate statin therapy at any age.  Cardiology referral should be considered for patients with CAC scores =400 or >=75th percentile.  *2018 AHA/ACC/AACVPR/AAPA/ABC/ACPM/ADA/AGS/APhA/ASPC/NLA/PCNA Guideline on the Management of Blood Cholesterol: A Report of the American College of Cardiology/American Heart Association Task Force on Clinical Practice Guidelines. J Am Coll Cardiol. 2019;73(24):3168-3209.  Stanly Leavens, MD  Electronically Signed: By: Stanly Leavens M.D. On: 04/07/2022 12:02     ______________________________________________________________________________________________      EKG:   EKG Interpretation Date/Time:  Monday January 11 2024 11:52:38 EDT Ventricular Rate:  65 PR Interval:  184 QRS Duration:  100 QT Interval:  430 QTC Calculation: 447 R Axis:   96  Text Interpretation: Sinus rhythm with occasional Premature ventricular complexes Rightward axis Incomplete right bundle branch block Nonspecific ST abnormality When compared with ECG of 04-Aug-2023 07:46, Fusion complexes are no longer Present Premature ventricular complexes are now Present Abberant conduction is no longer Present Confirmed by Wonda Sharper [phone]) on 01/11/2024 12:01:20 PM    Recent Labs: 11/03/2023: ALT 15; BUN 26; Creatinine, Ser 1.01; Hemoglobin 13.6; Platelets 212; Potassium 4.1; Sodium 138  Recent Lipid Panel    Component Value Date/Time   CHOL 132 11/04/2023 0537   TRIG 92 11/04/2023 0537   HDL 49 11/04/2023 0537   CHOLHDL 2.7 11/04/2023 0537   VLDL 18 11/04/2023 0537   LDLCALC 65 11/04/2023 0537     Risk Assessment/Calculations:                Physical Exam:    VS:  BP 124/74   Pulse 65   Ht 6' 4 (1.93 m)   Wt 234 lb 3.2 oz (106.2 kg)   SpO2 95%   BMI 28.51 kg/m     Wt Readings from Last 3 Encounters:  01/11/24 234 lb 3.2 oz (106.2 kg)  12/07/23 235 lb 14.4 oz (107 kg)  11/03/23 233 lb 14.5 oz (106.1 kg)     GEN:  Well nourished, well developed in no acute distress HEENT: Normal NECK: No JVD; No carotid bruits LYMPHATICS: No lymphadenopathy CARDIAC: RRR, no murmurs, rubs, gallops RESPIRATORY:  Clear to auscultation without rales,  wheezing or rhonchi  ABDOMEN: Soft, non-tender, non-distended MUSCULOSKELETAL:  No edema; No deformity  SKIN: Warm and dry NEUROLOGIC:  Alert and oriented x 3 PSYCHIATRIC:  Normal affect   Assessment & Plan PFO (patent foramen ovale) 78 year old male with type 2 diabetes, mixed hyperlipidemia, and history of hypertension presents for evaluation after cryptogenic stroke and 2D echo suggestive of PFO with positive bubble study.  Today, we discussed the association of PFO and  cryptogenic stroke.  We discussed randomized control trial data supporting transcatheter PFO closure in cryptogenic stroke patients less than 28 years old.  The patient understands that PFO occurs in about 25% of the general population, and that the incidence of PFO and a cryptogenic stroke population is higher, supporting its potential contribution to stroke risk.  I personally reviewed his echo images which showed no greater than a moderate shunt based on the number of bubbles crossing from right to left.  He understands the TEE would be necessary to better characterize the anatomy of his presumed PFO.  The patient's rope score is only 3 because of his age and the presence of diabetes.  This scoring system would indicate that there is a low likelihood that his PFO contributed to his stroke and that it is more likely the PFO is an innocent bystander.  I think ongoing medical therapy is appropriate with aspirin  and a statin drug.  Certainly, if this patient had a recurrent event on medical therapy, it would be appropriate to consider closure.  However, it is age I think the benefit would be fairly low and I would favor an approach of medical therapy.      Medication Adjustments/Labs and Tests Ordered: Current medicines are reviewed at length with the patient today.  Concerns regarding medicines are outlined above.  Orders Placed This Encounter  Procedures   EKG 12-Lead   No orders of the defined types were placed in this encounter.   Patient Instructions  Medication Instructions:  No medication changes were made at this visit. Continue current regimen.   *If you need a refill on your cardiac medications before your next appointment, please call your pharmacy*  Lab Work: None ordered today. If you have labs (blood work) drawn today and your tests are completely normal, you will receive your results only by: MyChart Message (if you have MyChart) OR A paper copy in the mail If you have any lab  test that is abnormal or we need to change your treatment, we will call you to review the results.  Testing/Procedures: None ordered today.  Follow-Up: At [facility], you and your health needs are our priority.  As part of our continuing mission to provide you with exceptional heart care, our providers are all part of one team.  This team includes your primary Cardiologist (physician) and Advanced Practice Providers or APPs (Physician Assistants and Nurse Practitioners) who all work together to provide you with the care you need, when you need it.  Your next appointment:   As needed  Provider:   Dr. Wonda    Signed, Ozell Wonda, MD  01/11/2024 4:59 PM    [facility]

## 2024-01-11 NOTE — Assessment & Plan Note (Signed)
 78 year old male with type 2 diabetes, mixed hyperlipidemia, and history of hypertension presents for evaluation after cryptogenic stroke and 2D echo suggestive of PFO with positive bubble study.  Today, we discussed the association of PFO and cryptogenic stroke.  We discussed randomized control trial data supporting transcatheter PFO closure in cryptogenic stroke patients less than 45 years old.  The patient understands that PFO occurs in about 25% of the general population, and that the incidence of PFO and a cryptogenic stroke population is higher, supporting its potential contribution to stroke risk.  I personally reviewed his echo images which showed no greater than a moderate shunt based on the number of bubbles crossing from right to left.  He understands the TEE would be necessary to better characterize the anatomy of his presumed PFO.  The patient's rope score is only 3 because of his age and the presence of diabetes.  This scoring system would indicate that there is a low likelihood that his PFO contributed to his stroke and that it is more likely the PFO is an innocent bystander.  I think ongoing medical therapy is appropriate with aspirin  and a statin drug.  Certainly, if this patient had a recurrent event on medical therapy, it would be appropriate to consider closure.  However, it is age I think the benefit would be fairly low and I would favor an approach of medical therapy.

## 2024-01-11 NOTE — Patient Instructions (Signed)
 Medication Instructions:  No medication changes were made at this visit. Continue current regimen.   *If you need a refill on your cardiac medications before your next appointment, please call your pharmacy*  Lab Work: None ordered today. If you have labs (blood work) drawn today and your tests are completely normal, you will receive your results only by: MyChart Message (if you have MyChart) OR A paper copy in the mail If you have any lab test that is abnormal or we need to change your treatment, we will call you to review the results.  Testing/Procedures: None ordered today.  Follow-Up: At [facility], you and your health needs are our priority.  As part of our continuing mission to provide you with exceptional heart care, our providers are all part of one team.  This team includes your primary Cardiologist (physician) and Advanced Practice Providers or APPs (Physician Assistants and Nurse Practitioners) who all work together to provide you with the care you need, when you need it.  Your next appointment:   As needed  Provider:   Dr. Wonda

## 2024-01-15 ENCOUNTER — Encounter: Admitting: Occupational Therapy

## 2024-01-16 NOTE — Progress Notes (Signed)
 Remote Loop Recorder Transmission

## 2024-01-17 DIAGNOSIS — E1165 Type 2 diabetes mellitus with hyperglycemia: Secondary | ICD-10-CM | POA: Diagnosis not present

## 2024-01-21 ENCOUNTER — Encounter: Admitting: Occupational Therapy

## 2024-01-28 DIAGNOSIS — Z794 Long term (current) use of insulin: Secondary | ICD-10-CM | POA: Diagnosis not present

## 2024-01-28 DIAGNOSIS — Z0000 Encounter for general adult medical examination without abnormal findings: Secondary | ICD-10-CM | POA: Diagnosis not present

## 2024-01-28 DIAGNOSIS — Z8673 Personal history of transient ischemic attack (TIA), and cerebral infarction without residual deficits: Secondary | ICD-10-CM | POA: Diagnosis not present

## 2024-01-28 DIAGNOSIS — E7800 Pure hypercholesterolemia, unspecified: Secondary | ICD-10-CM | POA: Diagnosis not present

## 2024-01-28 DIAGNOSIS — E113293 Type 2 diabetes mellitus with mild nonproliferative diabetic retinopathy without macular edema, bilateral: Secondary | ICD-10-CM | POA: Diagnosis not present

## 2024-01-28 DIAGNOSIS — N138 Other obstructive and reflux uropathy: Secondary | ICD-10-CM | POA: Diagnosis not present

## 2024-01-28 DIAGNOSIS — N401 Enlarged prostate with lower urinary tract symptoms: Secondary | ICD-10-CM | POA: Diagnosis not present

## 2024-01-28 DIAGNOSIS — E1140 Type 2 diabetes mellitus with diabetic neuropathy, unspecified: Secondary | ICD-10-CM | POA: Diagnosis not present

## 2024-01-28 DIAGNOSIS — Q2112 Patent foramen ovale: Secondary | ICD-10-CM | POA: Diagnosis not present

## 2024-01-28 DIAGNOSIS — Z1331 Encounter for screening for depression: Secondary | ICD-10-CM | POA: Diagnosis not present

## 2024-01-29 ENCOUNTER — Encounter: Admitting: Occupational Therapy

## 2024-02-01 NOTE — Progress Notes (Signed)
 Remote Loop Recorder Transmission

## 2024-02-03 DIAGNOSIS — E1165 Type 2 diabetes mellitus with hyperglycemia: Secondary | ICD-10-CM | POA: Diagnosis not present

## 2024-02-04 DIAGNOSIS — M2012 Hallux valgus (acquired), left foot: Secondary | ICD-10-CM | POA: Diagnosis not present

## 2024-02-04 DIAGNOSIS — M2011 Hallux valgus (acquired), right foot: Secondary | ICD-10-CM | POA: Diagnosis not present

## 2024-02-06 ENCOUNTER — Encounter

## 2024-02-08 ENCOUNTER — Encounter

## 2024-02-09 ENCOUNTER — Ambulatory Visit

## 2024-02-09 DIAGNOSIS — I639 Cerebral infarction, unspecified: Secondary | ICD-10-CM | POA: Diagnosis not present

## 2024-02-10 LAB — CUP PACEART REMOTE DEVICE CHECK: Date Time Interrogation Session: 20251006233853

## 2024-02-12 DIAGNOSIS — L570 Actinic keratosis: Secondary | ICD-10-CM | POA: Diagnosis not present

## 2024-02-12 DIAGNOSIS — Z85828 Personal history of other malignant neoplasm of skin: Secondary | ICD-10-CM | POA: Diagnosis not present

## 2024-02-12 DIAGNOSIS — D2372 Other benign neoplasm of skin of left lower limb, including hip: Secondary | ICD-10-CM | POA: Diagnosis not present

## 2024-02-12 DIAGNOSIS — D225 Melanocytic nevi of trunk: Secondary | ICD-10-CM | POA: Diagnosis not present

## 2024-02-12 DIAGNOSIS — Z86006 Personal history of melanoma in-situ: Secondary | ICD-10-CM | POA: Diagnosis not present

## 2024-02-12 DIAGNOSIS — L578 Other skin changes due to chronic exposure to nonionizing radiation: Secondary | ICD-10-CM | POA: Diagnosis not present

## 2024-02-12 DIAGNOSIS — L821 Other seborrheic keratosis: Secondary | ICD-10-CM | POA: Diagnosis not present

## 2024-02-12 NOTE — Progress Notes (Signed)
 Remote Loop Recorder Transmission

## 2024-02-27 ENCOUNTER — Ambulatory Visit: Payer: Self-pay | Admitting: Cardiology

## 2024-02-29 ENCOUNTER — Other Ambulatory Visit (HOSPITAL_COMMUNITY): Payer: Self-pay

## 2024-03-04 ENCOUNTER — Other Ambulatory Visit (HOSPITAL_COMMUNITY): Payer: Self-pay

## 2024-03-04 ENCOUNTER — Other Ambulatory Visit: Payer: Self-pay

## 2024-03-08 ENCOUNTER — Encounter

## 2024-03-10 ENCOUNTER — Encounter

## 2024-03-11 ENCOUNTER — Ambulatory Visit (INDEPENDENT_AMBULATORY_CARE_PROVIDER_SITE_OTHER)

## 2024-03-11 DIAGNOSIS — I639 Cerebral infarction, unspecified: Secondary | ICD-10-CM | POA: Diagnosis not present

## 2024-03-13 LAB — CUP PACEART REMOTE DEVICE CHECK: Date Time Interrogation Session: 20251107004059

## 2024-03-14 DIAGNOSIS — E1165 Type 2 diabetes mellitus with hyperglycemia: Secondary | ICD-10-CM | POA: Diagnosis not present

## 2024-03-15 NOTE — Progress Notes (Signed)
 Remote Loop Recorder Transmission

## 2024-03-20 ENCOUNTER — Ambulatory Visit: Payer: Self-pay | Admitting: Cardiology

## 2024-03-25 DIAGNOSIS — Z794 Long term (current) use of insulin: Secondary | ICD-10-CM | POA: Diagnosis not present

## 2024-03-25 DIAGNOSIS — N182 Chronic kidney disease, stage 2 (mild): Secondary | ICD-10-CM | POA: Diagnosis not present

## 2024-03-25 DIAGNOSIS — I129 Hypertensive chronic kidney disease with stage 1 through stage 4 chronic kidney disease, or unspecified chronic kidney disease: Secondary | ICD-10-CM | POA: Diagnosis not present

## 2024-03-25 DIAGNOSIS — E785 Hyperlipidemia, unspecified: Secondary | ICD-10-CM | POA: Diagnosis not present

## 2024-03-28 DIAGNOSIS — E1165 Type 2 diabetes mellitus with hyperglycemia: Secondary | ICD-10-CM | POA: Diagnosis not present

## 2024-03-28 DIAGNOSIS — Z794 Long term (current) use of insulin: Secondary | ICD-10-CM | POA: Diagnosis not present

## 2024-03-28 DIAGNOSIS — E1169 Type 2 diabetes mellitus with other specified complication: Secondary | ICD-10-CM | POA: Diagnosis not present

## 2024-03-28 DIAGNOSIS — E785 Hyperlipidemia, unspecified: Secondary | ICD-10-CM | POA: Diagnosis not present

## 2024-04-08 ENCOUNTER — Encounter

## 2024-04-11 ENCOUNTER — Ambulatory Visit

## 2024-04-11 ENCOUNTER — Encounter

## 2024-04-12 LAB — CUP PACEART REMOTE DEVICE CHECK: Date Time Interrogation Session: 20251207234032

## 2024-04-19 NOTE — Progress Notes (Signed)
 Remote Loop Recorder Transmission

## 2024-04-22 ENCOUNTER — Ambulatory Visit: Payer: Self-pay | Admitting: Cardiology

## 2024-05-09 ENCOUNTER — Encounter

## 2024-05-12 ENCOUNTER — Ambulatory Visit

## 2024-05-12 ENCOUNTER — Encounter

## 2024-05-12 DIAGNOSIS — I639 Cerebral infarction, unspecified: Secondary | ICD-10-CM

## 2024-05-13 LAB — CUP PACEART REMOTE DEVICE CHECK: Date Time Interrogation Session: 20260107233723

## 2024-05-13 NOTE — Progress Notes (Signed)
 Remote Loop Recorder Transmission

## 2024-05-14 ENCOUNTER — Ambulatory Visit: Payer: Self-pay | Admitting: Cardiology

## 2024-06-09 ENCOUNTER — Encounter

## 2024-06-12 ENCOUNTER — Ambulatory Visit

## 2024-07-10 ENCOUNTER — Encounter

## 2024-07-13 ENCOUNTER — Ambulatory Visit

## 2024-08-13 ENCOUNTER — Ambulatory Visit

## 2024-09-13 ENCOUNTER — Ambulatory Visit

## 2024-10-14 ENCOUNTER — Ambulatory Visit
